# Patient Record
Sex: Male | Born: 1970
Health system: Southern US, Community
[De-identification: ages and names within clinical notes are randomized; demographics above are authoritative.]

## PROBLEM LIST (undated history)

## (undated) DIAGNOSIS — I10 Essential (primary) hypertension: Secondary | ICD-10-CM

## (undated) DIAGNOSIS — G4733 Obstructive sleep apnea (adult) (pediatric): Secondary | ICD-10-CM

## (undated) HISTORY — PX: CYSTECTOMY: SUR359

## (undated) HISTORY — PX: HERNIA REPAIR: SHX51

## (undated) HISTORY — DX: Essential (primary) hypertension: I10

## (undated) HISTORY — PX: APPENDECTOMY: SHX54

## (undated) HISTORY — DX: Obstructive sleep apnea (adult) (pediatric): G47.33

---

## 2003-06-18 ENCOUNTER — Encounter: Admission: RE | Admit: 2003-06-18 | Discharge: 2003-06-18 | Payer: Self-pay | Admitting: *Deleted

## 2003-07-07 ENCOUNTER — Ambulatory Visit (HOSPITAL_BASED_OUTPATIENT_CLINIC_OR_DEPARTMENT_OTHER): Admission: RE | Admit: 2003-07-07 | Discharge: 2003-07-07 | Payer: Self-pay | Admitting: *Deleted

## 2003-07-07 ENCOUNTER — Ambulatory Visit (HOSPITAL_COMMUNITY): Admission: RE | Admit: 2003-07-07 | Discharge: 2003-07-07 | Payer: Self-pay | Admitting: *Deleted

## 2010-10-23 ENCOUNTER — Emergency Department (HOSPITAL_BASED_OUTPATIENT_CLINIC_OR_DEPARTMENT_OTHER)
Admission: EM | Admit: 2010-10-23 | Discharge: 2010-10-24 | Disposition: A | Discharge status: Other Acute Inpatient Hospital | Payer: BC Managed Care – PPO | Source: Home / Self Care | Attending: Emergency Medicine | Admitting: Emergency Medicine

## 2010-10-23 ENCOUNTER — Emergency Department (INDEPENDENT_AMBULATORY_CARE_PROVIDER_SITE_OTHER): Payer: BC Managed Care – PPO

## 2010-10-23 DIAGNOSIS — R141 Gas pain: Secondary | ICD-10-CM

## 2010-10-23 DIAGNOSIS — K37 Unspecified appendicitis: Secondary | ICD-10-CM | POA: Insufficient documentation

## 2010-10-23 DIAGNOSIS — R1031 Right lower quadrant pain: Secondary | ICD-10-CM | POA: Insufficient documentation

## 2010-10-23 LAB — CBC
MCH: 29.5 pg (ref 26.0–34.0)
Platelets: 177 10*3/uL (ref 150–400)
RBC: 5.01 MIL/uL (ref 4.22–5.81)
RDW: 12.5 % (ref 11.5–15.5)

## 2010-10-23 LAB — COMPREHENSIVE METABOLIC PANEL
ALT: 15 U/L (ref 0–53)
AST: 16 U/L (ref 0–37)
Albumin: 4.3 g/dL (ref 3.5–5.2)
CO2: 29 mEq/L (ref 19–32)
Calcium: 9.9 mg/dL (ref 8.4–10.5)
GFR calc non Af Amer: >60 mL/min (ref >60)
Sodium: 137 mEq/L (ref 135–145)
Total Protein: 6.9 g/dL (ref 6.0–8.3)

## 2010-10-23 LAB — DIFFERENTIAL
Basophils Relative: 0 % (ref 0–1)
Eosinophils Absolute: 0.2 10*3/uL (ref 0.0–0.7)
Eosinophils Relative: 1 % (ref 0–5)
Monocytes Relative: 9 % (ref 3–12)
Neutrophils Relative %: 77 % (ref 43–77)

## 2010-10-23 LAB — URINALYSIS, ROUTINE W REFLEX MICROSCOPIC
Hgb urine dipstick: NEGATIVE
Leukocytes, UA: NEGATIVE
Protein, ur: NEGATIVE mg/dL
Urobilinogen, UA: 1 mg/dL (ref 0.0–1.0)

## 2010-10-23 MED ORDER — IOHEXOL 300 MG/ML  SOLN
100.0000 mL | Freq: Once | INTRAMUSCULAR | Status: AC | PRN
  Administered 2010-10-23: 100 mL via INTRAVENOUS

## 2010-10-24 ENCOUNTER — Observation Stay (HOSPITAL_COMMUNITY)
Admit: 2010-10-24 | Discharge: 2010-10-24 | Disposition: A | Discharge status: Home / Self Care | Payer: BC Managed Care – PPO | Attending: General Surgery | Admitting: General Surgery

## 2010-10-24 ENCOUNTER — Other Ambulatory Visit (INDEPENDENT_AMBULATORY_CARE_PROVIDER_SITE_OTHER): Payer: Self-pay | Admitting: General Surgery

## 2010-10-24 DIAGNOSIS — K3580 Unspecified acute appendicitis: Principal | ICD-10-CM | POA: Insufficient documentation

## 2010-10-24 DIAGNOSIS — Z79899 Other long term (current) drug therapy: Secondary | ICD-10-CM | POA: Insufficient documentation

## 2010-10-24 DIAGNOSIS — R1031 Right lower quadrant pain: Secondary | ICD-10-CM

## 2010-10-24 DIAGNOSIS — K352 Acute appendicitis with generalized peritonitis, without abscess: Secondary | ICD-10-CM

## 2010-10-24 DIAGNOSIS — I10 Essential (primary) hypertension: Secondary | ICD-10-CM | POA: Insufficient documentation

## 2010-10-24 DIAGNOSIS — R110 Nausea: Secondary | ICD-10-CM

## 2010-10-30 ENCOUNTER — Telehealth (INDEPENDENT_AMBULATORY_CARE_PROVIDER_SITE_OTHER): Payer: Self-pay

## 2010-10-31 NOTE — H&P (Signed)
  NAMEHADLEY, DETLOFF             ACCOUNT NO.:  [id number]  MEDICAL RECORD NO.:  [id number]  LOCATION:  [facility]                         FACILITY[facility]  [facility]  PHYSICIAN:  Cherylynn Ridges, M.D.    DATE OF BIRTH:  Jul 06, 1970  DATE OF ADMISSION:  10/23/2010 DATE OF DISCHARGE:  10/24/2010                             HISTORY & PHYSICAL   IDENTIFICATION/CHIEF COMPLAINT:  The patient is a 40 year old gentleman with abdominal pain in the right lower quadrant and CT scan demonstrating likely acute appendicitis.  HISTORY OF PRESENT ILLNESS:  The patient's pain began abruptly in the upper abdomen with increased bloating and some discomfort about 6 o'clock yesterday evening associated with some nausea.  No vomiting, no fevers, no chills.  It worsened and then migrated to right lower quadrant.  It got to the point where he had to leave his son in cross practice to go to [facility] where he was ultimately diagnosed with acute appendicitis by CT scan in the abdomen and pelvis. He was subsequently transferred over to our hospital by [facility] because [facility] was not available at this time.  PAST MEDICAL HISTORY:  Significant for hypertension for which he takes lisinopril.  PAST SURGICAL HISTORY:  He has had bilateral inguinal hernia repair as a child and right wrist surgery several years ago at [facility].  He had no problems with anesthesia.  He has no known drug allergies.  Only medication is lisinopril, the dose of which is unknown.  REVIEW OF SYSTEMS:  He had no diarrhea or constipation.  No blood in stools.  No dysuria.  PHYSICAL EXAMINATION:  VITAL SIGNS:  Last vital signs done in [facility]; he is afebrile, pulse was 65, blood pressure 153/84. HEENT:  He is normocephalic and atraumatic and anicteric. NECK:  Supple.  No palpable masses.  No bruits. LUNGS:  Clear to auscultation bilaterally. CARDIAC:  Regular rhythm and rate with no  murmurs. ABDOMEN:  Soft but he is tender in the right lower quadrant with some guarding in that area.  No positive Rovsing sign. RECTAL:  Not performed. NEUROLOGIC:  Cranial nerves II through XII grossly intact.  Mental status exam was normal.  LABORATORY STUDIES:  White count of 15,400, hemoglobin of 14.8.  His electrolytes were all within normal limits.  Glucose is 107.  IMPRESSION:  Acute appendicitis by CT scan.  Clinical examination, the history and physical are very consistent with acute appendicitis.  PLAN:  Laparoscopic, possible open appendectomy.  Based on the CT scan, the patient's appendix appears to be retrocecal in which case, more dissection may be necessary in order to get it out.  However, I do plan on doing it laparoscopically.  Risks and benefits have been explained to the patient, he wished to proceed.     Cherylynn Ridges, M.D.     JOW/MEDQ  D:  10/24/2010  T:  10/24/2010  Job:  [id number]  Electronically Signed by Jimmye Norman M.D. on 10/31/2010 04:54:09 AM

## 2010-10-31 NOTE — Op Note (Signed)
NAMECOLETON, WOON             ACCOUNT NO.:  [id number]  MEDICAL RECORD NO.:  [id number]  LOCATION:  5120                         FACILITY:  [facility]  PHYSICIAN:  Cherylynn Ridges, M.D.    DATE OF BIRTH:  04-27-1971  DATE OF PROCEDURE:  10/24/2010 DATE OF DISCHARGE:                              OPERATIVE REPORT   PREOPERATIVE DIAGNOSIS:  Acute appendicitis.  POSTOPERATIVE DIAGNOSIS:  Early acute appendicitis.  PROCEDURE:  Laparoscopic appendectomy.  SURGEON:  Cherylynn Ridges, MD  ANESTHESIA:  Under general endotracheal.  ESTIMATED BLOOD LOSS:  Less than 20 mL.  COMPLICATIONS:  None.  CONDITION:  Stable.  FINDINGS:  Early acute appendicitis without perforation.  INDICATIONS FOR OPERATION:  The patient is a 40 year old gentleman who was transferred in for [facility] with CT findings of acute appendicitis and a history and physical consistent with that also, who now comes in for a lap appy.  OPERATION:  The patient was taken to the operating room, placed on the table in supine position.  After an adequate general endotracheal anesthetic was administered, he was prepped and draped in usual sterile manner, exposing his entire abdomen.  After a proper time-out was performed identifying the patient and the procedure to be performed, a supraumbilical midline incision was made using a #15 blade and taken down to the midline fascia.  We grabbed the fascia with 2 Kocher clamps and then incised between those clamps into the fascia using a 15 blade.  We went to the preperitoneal space, grabbed the edges of the fascia with Kocher clamps, and tented up on it as we bluntly dissected down into the peritoneal cavity.  Once we were in the peritoneal cavity, a pursestring suture of 0 Vicryl was passed around the fascial opening, we secured in a Hasson cannula which was subsequently passed.  We insufflated carbon dioxide gas up to a maximal intra-abdominal pressure of 50 mmHg  through the Hasson cannula.  Once this was done, the patient was placed in Trendelenburg, left side was tilted down.  Under direct vision, a right upper quadrant 5-mm cannula and a left lower quadrant 11-12-mm cannula were passed under direct vision.  We then used graspers and dissectors in order to identify and mobilize the appendix.  Although it appeared to be in a retrocecal position on CT scan, it was not retroperitonealized at the time of surgery.  We were able to mobilize it easily, isolate the base of the appendix and the mesoappendix from the base of the cecum.  We came across the base of the cecum using a 3.5-mm blue cartridge Endo-GIA and across the mesoappendix using a 2.5-mm white cartridge Endo-GIA.  This completely freed up the appendix which we retrieved from the left lower quadrant site using an EndoCatch bag.  There was no spillage.  We observed and visualized the right lower quadrant area where the appendix was removed.  There was no active bleeding.  We irrigated with about 300-400 mL of saline solution with no bleeding being sought.  We went ahead and removed the supraumbilical cannula and tied off the fascia using a pursestring suture in place.  This was done under the vision of  scope taken through the left lower quadrant cannula site.  We irrigated and aspirated all fluid from above the liver, removed all cannulas and then closed.  Marcaine 0.25% with epi was injected at all sites.  The supraumbilical and left lower quadrant skin sites were closed using a running subcuticular stitch of 4-0 Monocryl.  Dermabond, Steri-Strips, and Tegaderm were used to complete the dressing.  All needle counts, sponge counts, and instrument counts were correct.     Cherylynn Ridges, M.D.     JOW/MEDQ  D:  10/24/2010  T:  10/24/2010  Job:  [id number]  Electronically Signed by Jimmye Norman M.D. on 10/31/2010 08:19:35 AM

## 2010-11-09 ENCOUNTER — Ambulatory Visit (INDEPENDENT_AMBULATORY_CARE_PROVIDER_SITE_OTHER): Payer: BC Managed Care – PPO | Admitting: General Surgery

## 2010-11-09 ENCOUNTER — Encounter (INDEPENDENT_AMBULATORY_CARE_PROVIDER_SITE_OTHER): Payer: Self-pay | Admitting: General Surgery

## 2010-11-09 DIAGNOSIS — Z09 Encounter for follow-up examination after completed treatment for conditions other than malignant neoplasm: Secondary | ICD-10-CM

## 2010-11-09 NOTE — Progress Notes (Signed)
HPI The patient is status post laparoscopic appendectomy on October 25, 2010. His only postoperative problem with constipation leading to some right upper quadrant discomfort that has subsequently resolved. He is currently doing well with no fevers or chills.  PE On examination he is healing well. There is no evidence of infection with any of his wound his abdomen is soft and nontender. The dressings were removed from all incisions.  Studiy review Fungal studies to review currently  Assessment Doing well status post laparoscopic appendectomy  Plan The patient will be held off work until the first week of August the medicine consultation is unable to go back to any type of light duty. We will give him a letter to affect.

## 2010-12-09 NOTE — Discharge Summary (Signed)
  NAMEMAYAN, KLOEPFER             ACCOUNT NO.:  [id number]  MEDICAL RECORD NO.:  [id number]  LOCATION:  5120                         FACILITY:  [facility]  PHYSICIAN:  Mary Sella. Andrey Campanile, MD     DATE OF BIRTH:  [birth year]  DATE OF ADMISSION:  10/24/2010 DATE OF DISCHARGE:  10/24/2010                              DISCHARGE SUMMARY   HISTORY OF PRESENT ILLNESS:  Mr. Cheramie is a 40 year old gentleman who was transferred from [facility] with findings of acute appendicitis.  He was seen by Dr. Lindie Spruce and decision was made to proceed with surgical resection.  SUMMARY OF HOSPITAL COURSE:  The patient was admitted on October 24, 2010. The patient underwent laparoscopic appendectomy without complication. He was admitted to the floor in a stable condition.  Postoperatively, no significant issues were encountered.  The patient began tolerating regular diet and was determined to be stable for discharge same day.  DISCHARGE DIAGNOSIS:  Appendicitis status post laparoscopic appendectomy.  DISCHARGE MEDICATIONS:  The patient was given prescription for Vicodin 1- 2 tablets q.4-6 h p.r.n. pain.  He was given preprinted discharge instructions to follow and report to clinic in approximately 2-3 weeks for followup care.     Brayton El, PA-C   ______________________________ Mary Sella. Andrey Campanile, MD    KB/MEDQ  D:  12/04/2010  T:  12/05/2010  Job:  [id number]  Electronically Signed by Brayton El  on 12/08/2010 09:56:19 AM Electronically Signed by Gaynelle Adu M.D. on 12/09/2010 12:00:56 PM

## 2011-04-16 ENCOUNTER — Other Ambulatory Visit: Payer: Self-pay | Admitting: Dermatology

## 2015-09-30 DIAGNOSIS — R358 Other polyuria: Secondary | ICD-10-CM | POA: Diagnosis not present

## 2015-09-30 DIAGNOSIS — I10 Essential (primary) hypertension: Secondary | ICD-10-CM | POA: Diagnosis not present

## 2015-09-30 DIAGNOSIS — Z1389 Encounter for screening for other disorder: Secondary | ICD-10-CM | POA: Diagnosis not present

## 2015-09-30 DIAGNOSIS — R42 Dizziness and giddiness: Secondary | ICD-10-CM | POA: Diagnosis not present

## 2015-10-21 DIAGNOSIS — Z0000 Encounter for general adult medical examination without abnormal findings: Secondary | ICD-10-CM | POA: Diagnosis not present

## 2015-10-21 DIAGNOSIS — Z125 Encounter for screening for malignant neoplasm of prostate: Secondary | ICD-10-CM | POA: Diagnosis not present

## 2015-10-21 DIAGNOSIS — I10 Essential (primary) hypertension: Secondary | ICD-10-CM | POA: Diagnosis not present

## 2015-11-04 DIAGNOSIS — Z1389 Encounter for screening for other disorder: Secondary | ICD-10-CM | POA: Diagnosis not present

## 2015-11-04 DIAGNOSIS — Z0000 Encounter for general adult medical examination without abnormal findings: Secondary | ICD-10-CM | POA: Diagnosis not present

## 2015-11-04 DIAGNOSIS — I10 Essential (primary) hypertension: Secondary | ICD-10-CM | POA: Diagnosis not present

## 2015-11-04 DIAGNOSIS — G44209 Tension-type headache, unspecified, not intractable: Secondary | ICD-10-CM | POA: Diagnosis not present

## 2015-11-04 DIAGNOSIS — F330 Major depressive disorder, recurrent, mild: Secondary | ICD-10-CM | POA: Diagnosis not present

## 2015-11-04 DIAGNOSIS — R079 Chest pain, unspecified: Secondary | ICD-10-CM | POA: Diagnosis not present

## 2016-11-02 DIAGNOSIS — E039 Hypothyroidism, unspecified: Secondary | ICD-10-CM | POA: Diagnosis not present

## 2016-11-02 DIAGNOSIS — Z125 Encounter for screening for malignant neoplasm of prostate: Secondary | ICD-10-CM | POA: Diagnosis not present

## 2016-11-02 DIAGNOSIS — Z0000 Encounter for general adult medical examination without abnormal findings: Secondary | ICD-10-CM | POA: Diagnosis not present

## 2016-11-02 DIAGNOSIS — I10 Essential (primary) hypertension: Secondary | ICD-10-CM | POA: Diagnosis not present

## 2016-11-09 DIAGNOSIS — G4709 Other insomnia: Secondary | ICD-10-CM | POA: Diagnosis not present

## 2016-11-09 DIAGNOSIS — Z1389 Encounter for screening for other disorder: Secondary | ICD-10-CM | POA: Diagnosis not present

## 2016-11-09 DIAGNOSIS — I10 Essential (primary) hypertension: Secondary | ICD-10-CM | POA: Diagnosis not present

## 2016-11-09 DIAGNOSIS — F330 Major depressive disorder, recurrent, mild: Secondary | ICD-10-CM | POA: Diagnosis not present

## 2016-11-09 DIAGNOSIS — Z0000 Encounter for general adult medical examination without abnormal findings: Secondary | ICD-10-CM | POA: Diagnosis not present

## 2016-11-22 DIAGNOSIS — M62830 Muscle spasm of back: Secondary | ICD-10-CM | POA: Diagnosis not present

## 2016-11-22 DIAGNOSIS — S39012A Strain of muscle, fascia and tendon of lower back, initial encounter: Secondary | ICD-10-CM | POA: Diagnosis not present

## 2017-01-10 DIAGNOSIS — L309 Dermatitis, unspecified: Secondary | ICD-10-CM | POA: Diagnosis not present

## 2017-01-10 DIAGNOSIS — B078 Other viral warts: Secondary | ICD-10-CM | POA: Diagnosis not present

## 2017-01-10 DIAGNOSIS — L821 Other seborrheic keratosis: Secondary | ICD-10-CM | POA: Diagnosis not present

## 2017-11-06 DIAGNOSIS — Z125 Encounter for screening for malignant neoplasm of prostate: Secondary | ICD-10-CM | POA: Diagnosis not present

## 2017-11-06 DIAGNOSIS — Z0000 Encounter for general adult medical examination without abnormal findings: Secondary | ICD-10-CM | POA: Diagnosis not present

## 2017-11-06 DIAGNOSIS — R82998 Other abnormal findings in urine: Secondary | ICD-10-CM | POA: Diagnosis not present

## 2017-11-06 DIAGNOSIS — I10 Essential (primary) hypertension: Secondary | ICD-10-CM | POA: Diagnosis not present

## 2017-11-12 DIAGNOSIS — F330 Major depressive disorder, recurrent, mild: Secondary | ICD-10-CM | POA: Diagnosis not present

## 2017-11-12 DIAGNOSIS — Z1389 Encounter for screening for other disorder: Secondary | ICD-10-CM | POA: Diagnosis not present

## 2017-11-12 DIAGNOSIS — Z0000 Encounter for general adult medical examination without abnormal findings: Secondary | ICD-10-CM | POA: Diagnosis not present

## 2017-11-12 DIAGNOSIS — G4709 Other insomnia: Secondary | ICD-10-CM | POA: Diagnosis not present

## 2017-11-12 DIAGNOSIS — I10 Essential (primary) hypertension: Secondary | ICD-10-CM | POA: Diagnosis not present

## 2017-11-15 DIAGNOSIS — Z1212 Encounter for screening for malignant neoplasm of rectum: Secondary | ICD-10-CM | POA: Diagnosis not present

## 2018-03-05 ENCOUNTER — Other Ambulatory Visit: Payer: Self-pay

## 2018-03-05 ENCOUNTER — Encounter (HOSPITAL_COMMUNITY): Payer: Self-pay | Admitting: Emergency Medicine

## 2018-03-05 ENCOUNTER — Emergency Department (HOSPITAL_COMMUNITY): Payer: BLUE CROSS/BLUE SHIELD

## 2018-03-05 ENCOUNTER — Emergency Department (HOSPITAL_COMMUNITY)
Admission: EM | Admit: 2018-03-05 | Discharge: 2018-03-05 | Disposition: A | Discharge status: Home / Self Care | Payer: BLUE CROSS/BLUE SHIELD | Attending: Emergency Medicine | Admitting: Emergency Medicine

## 2018-03-05 DIAGNOSIS — I10 Essential (primary) hypertension: Secondary | ICD-10-CM | POA: Diagnosis not present

## 2018-03-05 DIAGNOSIS — M79602 Pain in left arm: Secondary | ICD-10-CM | POA: Diagnosis not present

## 2018-03-05 DIAGNOSIS — R001 Bradycardia, unspecified: Secondary | ICD-10-CM | POA: Diagnosis not present

## 2018-03-05 DIAGNOSIS — Z79899 Other long term (current) drug therapy: Secondary | ICD-10-CM | POA: Diagnosis not present

## 2018-03-05 DIAGNOSIS — M79622 Pain in left upper arm: Secondary | ICD-10-CM | POA: Diagnosis not present

## 2018-03-05 LAB — CBC WITH DIFFERENTIAL/PLATELET
Abs Immature Granulocytes: 0.03 10*3/uL (ref 0.00–0.07)
Basophils Absolute: 0 10*3/uL (ref 0.0–0.1)
Basophils Relative: 1 %
Eosinophils Absolute: 0.3 10*3/uL (ref 0.0–0.5)
Eosinophils Relative: 4 %
HCT: 45.8 % (ref 39.0–52.0)
Hemoglobin: 15.7 g/dL (ref 13.0–17.0)
Immature Granulocytes: 0 %
Lymphocytes Relative: 29 %
Lymphs Abs: 2.2 10*3/uL (ref 0.7–4.0)
MCH: 29.6 pg (ref 26.0–34.0)
MCHC: 34.3 g/dL (ref 30.0–36.0)
MCV: 86.3 fL (ref 80.0–100.0)
Monocytes Absolute: 0.7 10*3/uL (ref 0.1–1.0)
Monocytes Relative: 9 %
Neutro Abs: 4.2 10*3/uL (ref 1.7–7.7)
Neutrophils Relative %: 57 %
Platelets: 187 10*3/uL (ref 150–400)
RBC: 5.31 MIL/uL (ref 4.22–5.81)
RDW: 11.7 % (ref 11.5–15.5)
WBC: 7.5 10*3/uL (ref 4.0–10.5)
nRBC: 0 % (ref 0.0–0.2)

## 2018-03-05 LAB — BASIC METABOLIC PANEL
ANION GAP: 9 (ref 5–15)
BUN: 14 mg/dL (ref 6–20)
CHLORIDE: 103 mmol/L (ref 98–111)
CO2: 27 mmol/L (ref 22–32)
Calcium: 9 mg/dL (ref 8.9–10.3)
Creatinine, Ser: 0.89 mg/dL (ref 0.61–1.24)
GFR calc Af Amer: >60 mL/min (ref >60)
GFR calc non Af Amer: >60 mL/min (ref >60)
GLUCOSE: 96 mg/dL (ref 70–99)
POTASSIUM: 3.9 mmol/L (ref 3.5–5.1)
Sodium: 139 mmol/L (ref 135–145)

## 2018-03-05 LAB — I-STAT TROPONIN, ED: Troponin i, poc: 0.01 ng/mL (ref 0.00–0.08)

## 2018-03-05 NOTE — ED Triage Notes (Signed)
PT reports left upper arm discomfort that started at 8am. PT has his BP checked at work and is hypertensive. PT reports he has felt clammy as well. Pt denies chest pain, SOB, neck pain, headaches, and dizziness.

## 2018-03-05 NOTE — ED Notes (Signed)
Lab called - BMET needs to be recollected

## 2018-03-05 NOTE — ED Notes (Signed)
Patient verbalizes understanding of discharge instructions. Opportunity for questioning and answers were provided. Armband removed by staff, pt discharged from ED ambulatory to lobby with wife.

## 2018-03-05 NOTE — Discharge Instructions (Addendum)
Please read attached information. If you experience any new or worsening signs or symptoms please return to the emergency room for evaluation. Please follow-up with your primary care provider or specialist as discussed.  °

## 2018-03-05 NOTE — ED Provider Notes (Signed)
[facility] EMERGENCY DEPARTMENT Provider Note   CSN: [id number] Arrival date & time: 03/05/18  1122   History   Chief Complaint Chief Complaint  Patient presents with  . Hypertension  . Arm Pain    HPI Ryan Skinner is a 47 y.o. male.  HPI   47 year old male presents today with complaints of hypertension.  She notes that this morning around 8:30 AM he developed pain in his left arm along the biceps.  Patient notes this was worse with working.  He notes that he had his blood pressure taken and was noted to be in the 488 systolic range.   Patient denies any associated chest pain or shortness of breath.  Ports that after sitting down and resting symptoms improved.  Patient notes since then symptoms have been coming and going.  Patient denies any personal cardiac history, notes a history of hypertension, denies any history of hyperlipidemia, diabetes, or smoking.  Patient notes his mother did have " minor heart attacks" in her late 48s.  Patient notes he takes losartan for high blood pressure and reports he took it this morning.   Past Medical History:  Diagnosis Date  . Hypertension     Patient Active Problem List   Diagnosis Date Noted  . Postop check 11/09/2010    Past Surgical History:  Procedure Laterality Date  . APPENDECTOMY    . CYSTECTOMY     right wrist      Home Medications    Prior to Admission medications   Medication Sig Start Date End Date Taking? Authorizing Provider  lisinopril (PRINIVIL,ZESTRIL) 10 MG tablet Take 10 mg by mouth daily.      [provider]  nystatin-triamcinolone Lilyan Gilford II) cream  08/09/10   [provider]    Family History Family History  Problem Relation Age of Onset  . Heart disease Mother   . Hypertension Father     Social History Social History   Tobacco Use  . Smoking status: Never Smoker  Substance Use Topics  . Alcohol use: Yes    Comment: occasional  . Drug use: No      Allergies   Patient has no known allergies.  Review of Systems Review of Systems  All other systems reviewed and are negative.  Physical Exam Updated Vital Signs BP (!) 132/92 (BP Location: Right Arm)   Pulse (!) 55   Resp 16   Wt 111.1 kg   SpO2 96%   Physical Exam  Constitutional: He is oriented to person, place, and time. He appears well-developed and well-nourished.  HENT:  Head: Normocephalic and atraumatic.  Eyes: Pupils are equal, round, and reactive to light. Conjunctivae are normal. Right eye exhibits no discharge. Left eye exhibits no discharge. No scleral icterus.  Neck: Normal range of motion. No JVD present. No tracheal deviation present.  Cardiovascular: Normal rate, regular rhythm, normal heart sounds and intact distal pulses. Exam reveals no gallop and no friction rub.  No murmur heard. Pulmonary/Chest: Effort normal and breath sounds normal. No stridor. No respiratory distress. He has no wheezes. He has no rales. He exhibits no tenderness.  Musculoskeletal:  Left arm atraumatic no swelling or edema nontender to palpation radial pulse 2+-no cervical spinal or upper thoracic spinal tenderness to palpation  Neurological: He is alert and oriented to person, place, and time. Coordination normal.  Psychiatric: He has a normal mood and affect. His behavior is normal. Judgment and thought content normal.  Nursing note and vitals  reviewed.   ED Treatments / Results  Labs (all labs ordered are listed, but only abnormal results are displayed) Labs Reviewed  CBC WITH DIFFERENTIAL/PLATELET  BASIC METABOLIC PANEL  I-STAT TROPONIN, ED    EKG None   ED EKG  Radiology Dg Chest 2 View  Result Date: 03/05/2018 CLINICAL DATA:  Left upper arm pain started at 8 a.m. EXAM: CHEST - 2 VIEW COMPARISON:  None. FINDINGS: The heart size and mediastinal contours are within normal limits. Both lungs are clear. The visualized skeletal structures are unremarkable. IMPRESSION:  No active cardiopulmonary disease. Electronically Signed   By: Kathreen Devoid   On: 03/05/2018 13:10    Procedures Procedures (including critical care time)  Medications Ordered in ED Medications - No data to display   Initial Impression / Assessment and Plan / ED Course  I have reviewed the triage vital signs and the nursing notes.  Pertinent labs & imaging results that were available during my care of the patient were reviewed by me and considered in my medical decision making (see chart for details).     Labs: I stat trop, CBC, BMP  Imaging: DG Chest 2 view   Consults:  Therapeutics:  Discharge Meds:   Assessment/Plan: 47 year old male presents today with complaints of left arm pain.  This is nonspecific nonreproducible.  He has no associated chest pain or shortness of breath.  He has a very low heart score of 2 with no signs of ACS, reassuring EKG and troponin.  Symptoms started at 8:30 AM-approximately 3 hours prior to arrival.  Patient will follow-up as an outpatient with his primary care, is given strict return precautions, he verbalized understanding and agreement to today's plan had no further questions or concerns at time discharge.   Final Clinical Impressions(s) / ED Diagnoses   Final diagnoses:  Hypertension, unspecified type  Pain of left upper extremity    ED Discharge Orders    None       Francee Gentile 03/05/18 2232    Gareth Morgan, MD 03/06/18 0008

## 2018-03-12 DIAGNOSIS — I10 Essential (primary) hypertension: Secondary | ICD-10-CM | POA: Diagnosis not present

## 2018-03-12 DIAGNOSIS — Z6831 Body mass index (BMI) 31.0-31.9, adult: Secondary | ICD-10-CM | POA: Diagnosis not present

## 2018-04-01 DIAGNOSIS — Z6831 Body mass index (BMI) 31.0-31.9, adult: Secondary | ICD-10-CM | POA: Diagnosis not present

## 2018-04-01 DIAGNOSIS — I10 Essential (primary) hypertension: Secondary | ICD-10-CM | POA: Diagnosis not present

## 2018-04-01 DIAGNOSIS — L258 Unspecified contact dermatitis due to other agents: Secondary | ICD-10-CM | POA: Diagnosis not present

## 2018-06-18 DIAGNOSIS — B358 Other dermatophytoses: Secondary | ICD-10-CM | POA: Diagnosis not present

## 2018-06-18 DIAGNOSIS — L309 Dermatitis, unspecified: Secondary | ICD-10-CM | POA: Diagnosis not present

## 2018-07-02 DIAGNOSIS — I10 Essential (primary) hypertension: Secondary | ICD-10-CM | POA: Diagnosis not present

## 2018-07-02 DIAGNOSIS — Z6830 Body mass index (BMI) 30.0-30.9, adult: Secondary | ICD-10-CM | POA: Diagnosis not present

## 2018-07-16 DIAGNOSIS — L281 Prurigo nodularis: Secondary | ICD-10-CM | POA: Diagnosis not present

## 2018-07-16 DIAGNOSIS — L309 Dermatitis, unspecified: Secondary | ICD-10-CM | POA: Diagnosis not present

## 2018-07-16 DIAGNOSIS — B359 Dermatophytosis, unspecified: Secondary | ICD-10-CM | POA: Diagnosis not present

## 2019-03-09 DIAGNOSIS — Z125 Encounter for screening for malignant neoplasm of prostate: Secondary | ICD-10-CM | POA: Diagnosis not present

## 2019-03-09 DIAGNOSIS — I10 Essential (primary) hypertension: Secondary | ICD-10-CM | POA: Diagnosis not present

## 2019-03-09 DIAGNOSIS — R7989 Other specified abnormal findings of blood chemistry: Secondary | ICD-10-CM | POA: Diagnosis not present

## 2019-03-09 DIAGNOSIS — Z0000 Encounter for general adult medical examination without abnormal findings: Secondary | ICD-10-CM | POA: Diagnosis not present

## 2019-03-16 DIAGNOSIS — F330 Major depressive disorder, recurrent, mild: Secondary | ICD-10-CM | POA: Diagnosis not present

## 2019-03-16 DIAGNOSIS — M79602 Pain in left arm: Secondary | ICD-10-CM | POA: Diagnosis not present

## 2019-03-16 DIAGNOSIS — I10 Essential (primary) hypertension: Secondary | ICD-10-CM | POA: Diagnosis not present

## 2019-03-16 DIAGNOSIS — Z1331 Encounter for screening for depression: Secondary | ICD-10-CM | POA: Diagnosis not present

## 2019-03-16 DIAGNOSIS — Z0000 Encounter for general adult medical examination without abnormal findings: Secondary | ICD-10-CM | POA: Diagnosis not present

## 2019-03-16 DIAGNOSIS — Z23 Encounter for immunization: Secondary | ICD-10-CM | POA: Diagnosis not present

## 2019-03-16 DIAGNOSIS — G4700 Insomnia, unspecified: Secondary | ICD-10-CM | POA: Diagnosis not present

## 2019-04-29 DIAGNOSIS — M67912 Unspecified disorder of synovium and tendon, left shoulder: Secondary | ICD-10-CM | POA: Diagnosis not present

## 2019-05-26 DIAGNOSIS — Z20828 Contact with and (suspected) exposure to other viral communicable diseases: Secondary | ICD-10-CM | POA: Diagnosis not present

## 2019-06-12 ENCOUNTER — Ambulatory Visit: Payer: Self-pay

## 2020-01-06 DIAGNOSIS — K921 Melena: Secondary | ICD-10-CM | POA: Diagnosis not present

## 2020-01-07 ENCOUNTER — Encounter: Payer: Self-pay | Admitting: Nurse Practitioner

## 2020-01-14 DIAGNOSIS — R05 Cough: Secondary | ICD-10-CM | POA: Diagnosis not present

## 2020-01-14 DIAGNOSIS — Z1152 Encounter for screening for COVID-19: Secondary | ICD-10-CM | POA: Diagnosis not present

## 2020-01-14 DIAGNOSIS — U071 COVID-19: Secondary | ICD-10-CM | POA: Diagnosis not present

## 2020-01-25 DIAGNOSIS — M67911 Unspecified disorder of synovium and tendon, right shoulder: Secondary | ICD-10-CM | POA: Diagnosis not present

## 2020-02-10 ENCOUNTER — Ambulatory Visit: Payer: Self-pay | Admitting: Nurse Practitioner

## 2020-02-19 ENCOUNTER — Ambulatory Visit: Payer: Self-pay | Admitting: Gastroenterology

## 2020-04-01 ENCOUNTER — Ambulatory Visit (INDEPENDENT_AMBULATORY_CARE_PROVIDER_SITE_OTHER): Payer: BC Managed Care – PPO | Admitting: Gastroenterology

## 2020-04-01 ENCOUNTER — Encounter: Payer: Self-pay | Admitting: Gastroenterology

## 2020-04-01 VITALS — BP 140/76 | HR 57 | Ht 74.5 in | Wt 221.0 lb

## 2020-04-01 DIAGNOSIS — K921 Melena: Secondary | ICD-10-CM | POA: Diagnosis not present

## 2020-04-01 MED ORDER — NA SULFATE-K SULFATE-MG SULF 17.5-3.13-1.6 GM/177ML PO SOLN: 1.0000 | Freq: Once | ORAL | 0 refills | Status: AC

## 2020-04-01 NOTE — Progress Notes (Signed)
History of Present Illness: This is a 49 year old male referred by Ryan Hatchet, MD for the evaluation of a change in bowel habits and hematochezia. He relates change in his diet a few months ago and he notes less frequent stools with occasional straining. In July had an episode of painless hematochezia. He notes mild intermittent LLQ pain. No prior colonoscopy. Denies weight loss, abdominal pain, diarrhea, change in stool caliber, melena, nausea, vomiting, dysphagia, reflux symptoms, chest pain.    No Known Allergies Outpatient Medications Prior to Visit  Medication Sig Dispense Refill  . lisinopril (PRINIVIL,ZESTRIL) 10 MG tablet Take 10 mg by mouth daily.      Marland Kitchen nystatin-triamcinolone (MYCOLOG II) cream      No facility-administered medications prior to visit.   Past Medical History:  Diagnosis Date  . Hypertension    Past Surgical History:  Procedure Laterality Date  . APPENDECTOMY    . CYSTECTOMY     right wrist  . HERNIA REPAIR     Social History   Socioeconomic History  . Marital status: Married    Spouse name: Not on file  . Number of children: Not on file  . Years of education: Not on file  . Highest education level: Not on file  Occupational History  . Not on file  Tobacco Use  . Smoking status: Never Smoker  Substance and Sexual Activity  . Alcohol use: Yes    Comment: occasional  . Drug use: No  . Sexual activity: Not on file  Other Topics Concern  . Not on file  Social History Narrative  . Not on file   Social Determinants of Health   Financial Resource Strain:   . Difficulty of Paying Living Expenses: Not on file  Food Insecurity:   . Worried About Charity fundraiser in the Last Year: Not on file  . Ran Out of Food in the Last Year: Not on file  Transportation Needs:   . Lack of Transportation (Medical): Not on file  . Lack of Transportation (Non-Medical): Not on file  Physical Activity:   . Days of Exercise per Week: Not on file  .  Minutes of Exercise per Session: Not on file  Stress:   . Feeling of Stress : Not on file  Social Connections:   . Frequency of Communication with Friends and Family: Not on file  . Frequency of Social Gatherings with Friends and Family: Not on file  . Attends Religious Services: Not on file  . Active Member of Clubs or Organizations: Not on file  . Attends Archivist Meetings: Not on file  . Marital Status: Not on file   Family History  Problem Relation Age of Onset  . Heart disease Mother   . Hypertension Father   . Diabetes Father   . Prostate cancer Paternal Uncle      Review of Systems: Pertinent positive and negative review of systems were noted in the above HPI section. All other review of systems were otherwise negative.   Physical Exam: General: Well developed, well nourished, no acute distress Head: Normocephalic and atraumatic Eyes:  sclerae anicteric, EOMI Ears: Normal auditory acuity Mouth: Not examined, mask on during Covid-19 pandemic Neck: Supple, no masses or thyromegaly Lungs: Clear throughout to auscultation Heart: Regular rate and rhythm; no murmurs, rubs or bruits Abdomen: Soft, non tender and non distended. No masses, hepatosplenomegaly or hernias noted. Normal Bowel sounds Rectal: Deferred to colonoscopy Musculoskeletal: Symmetrical with no gross  deformities  Skin: No lesions on visible extremities Pulses:  Normal pulses noted Extremities: No clubbing, cyanosis, edema or deformities noted Neurological: Alert oriented x 4, grossly nonfocal Cervical Nodes:  No significant cervical adenopathy Inguinal Nodes: No significant inguinal adenopathy Psychological:  Alert and cooperative. Normal mood and affect   Assessment and Recommendations:  1.  Change in bowel habits, mild constipation, hematochezia.  Evaluate for colorectal neoplasms.  Increase dietary fiber and daily water intake.  Schedule colonoscopy. The risks (including bleeding,  perforation, infection, missed lesions, medication reactions and possible hospitalization or surgery if complications occur), benefits, and alternatives to colonoscopy with possible biopsy and possible polypectomy were discussed with the patient and they consent to proceed.     cc: Ryan Hatchet, MD

## 2020-04-01 NOTE — Patient Instructions (Signed)
You have been scheduled for a colonoscopy. Please follow written instructions given to you at your visit today.  Please pick up your prep supplies at the pharmacy within the next 1-3 days. If you use inhalers (even only as needed), please bring them with you on the day of your procedure.  Thank you for choosing me and [facility].  Malcolm T. Stark, Jr., MD., FACG  

## 2020-05-20 DIAGNOSIS — Z125 Encounter for screening for malignant neoplasm of prostate: Secondary | ICD-10-CM | POA: Diagnosis not present

## 2020-05-20 DIAGNOSIS — Z79899 Other long term (current) drug therapy: Secondary | ICD-10-CM | POA: Diagnosis not present

## 2020-05-20 DIAGNOSIS — I10 Essential (primary) hypertension: Secondary | ICD-10-CM | POA: Diagnosis not present

## 2020-05-27 DIAGNOSIS — Z23 Encounter for immunization: Secondary | ICD-10-CM | POA: Diagnosis not present

## 2020-05-27 DIAGNOSIS — R0789 Other chest pain: Secondary | ICD-10-CM | POA: Diagnosis not present

## 2020-05-27 DIAGNOSIS — Z0000 Encounter for general adult medical examination without abnormal findings: Secondary | ICD-10-CM | POA: Diagnosis not present

## 2020-05-27 DIAGNOSIS — R82998 Other abnormal findings in urine: Secondary | ICD-10-CM | POA: Diagnosis not present

## 2020-05-31 DIAGNOSIS — K921 Melena: Secondary | ICD-10-CM | POA: Diagnosis not present

## 2020-06-03 ENCOUNTER — Telehealth: Payer: Self-pay | Admitting: Gastroenterology

## 2020-06-03 ENCOUNTER — Other Ambulatory Visit: Payer: Self-pay

## 2020-06-03 ENCOUNTER — Encounter: Payer: Self-pay | Admitting: Gastroenterology

## 2020-06-03 ENCOUNTER — Ambulatory Visit (AMBULATORY_SURGERY_CENTER): Payer: BC Managed Care – PPO | Admitting: Gastroenterology

## 2020-06-03 VITALS — BP 112/67 | HR 56 | Temp 97.0°F | Resp 21 | Ht 74.0 in | Wt 221.0 lb

## 2020-06-03 DIAGNOSIS — D120 Benign neoplasm of cecum: Secondary | ICD-10-CM | POA: Diagnosis not present

## 2020-06-03 DIAGNOSIS — K5730 Diverticulosis of large intestine without perforation or abscess without bleeding: Secondary | ICD-10-CM | POA: Diagnosis not present

## 2020-06-03 DIAGNOSIS — K640 First degree hemorrhoids: Secondary | ICD-10-CM | POA: Diagnosis not present

## 2020-06-03 DIAGNOSIS — Z1211 Encounter for screening for malignant neoplasm of colon: Secondary | ICD-10-CM | POA: Diagnosis not present

## 2020-06-03 DIAGNOSIS — D123 Benign neoplasm of transverse colon: Secondary | ICD-10-CM

## 2020-06-03 DIAGNOSIS — R194 Change in bowel habit: Secondary | ICD-10-CM

## 2020-06-03 DIAGNOSIS — D124 Benign neoplasm of descending colon: Secondary | ICD-10-CM

## 2020-06-03 DIAGNOSIS — K921 Melena: Secondary | ICD-10-CM | POA: Diagnosis not present

## 2020-06-03 DIAGNOSIS — D125 Benign neoplasm of sigmoid colon: Secondary | ICD-10-CM | POA: Diagnosis not present

## 2020-06-03 MED ORDER — SODIUM CHLORIDE 0.9 % IV SOLN: 500.0000 mL | INTRAVENOUS | Status: DC

## 2020-06-03 NOTE — Op Note (Signed)
[place] Patient Name: Ryan Skinner Procedure Date: 06/03/2020 8:59 AM MRN: [id number] Endoscopist: Ladene Artist , MD Age: 50 Referring MD:  Date of Birth: Apr 11, 1971 Gender: Male Account #: [id number] Procedure:                Colonoscopy Indications:              Hematochezia, Change in bowel habits Medicines:                Monitored Anesthesia Care Procedure:                Pre-Anesthesia Assessment:                           - Prior to the procedure, a History and Physical                            was performed, and patient medications and                            allergies were reviewed. The patient's tolerance of                            previous anesthesia was also reviewed. The risks                            and benefits of the procedure and the sedation                            options and risks were discussed with the patient.                            All questions were answered, and informed consent                            was obtained. Prior Anticoagulants: The patient has                            taken no previous anticoagulant or antiplatelet                            agents. ASA Grade Assessment: II - A patient with                            mild systemic disease. After reviewing the risks                            and benefits, the patient was deemed in                            satisfactory condition to undergo the procedure.                           After obtaining informed consent, the colonoscope  was passed under direct vision. Throughout the                            procedure, the patient's blood pressure, pulse, and                            oxygen saturations were monitored continuously. The                            Olympus CF-HQ190 [id number]) Colonoscope was                            introduced through the anus and advanced to the the                            cecum, identified by  appendiceal orifice and                            ileocecal valve. The ileocecal valve, appendiceal                            orifice, and rectum were photographed. The quality                            of the bowel preparation was good. The colonoscopy                            was performed without difficulty. The patient                            tolerated the procedure well. Scope In: 9:18:40 AM Scope Out: 9:44:51 AM Scope Withdrawal Time: 0 hours 23 minutes 58 seconds  Total Procedure Duration: 0 hours 26 minutes 11 seconds  Findings:                 The perianal and digital rectal examinations were                            normal.                           A 15 mm polyp was found in the descending colon.                            The polyp was semi-pedunculated. The polyp was                            removed with a hot snare. Resection and retrieval                            were complete.                           Four sessile polyps were found in the sigmoid  colon, transverse colon (2) and cecum. The polyps                            were 5 to 7 mm in size. These polyps were removed                            with a cold snare. Resection and retrieval were                            complete.                           A few small-mouthed diverticula were found in the                            left colon. There was no evidence of diverticular                            bleeding.                           Internal hemorrhoids were found during                            retroflexion. The hemorrhoids were moderate and                            Grade I (internal hemorrhoids that do not prolapse).                           The exam was otherwise without abnormality on                            direct and retroflexion views. Complications:            No immediate complications. Estimated blood loss:                            None. Estimated  Blood Loss:     Estimated blood loss: none. Impression:               - One 15 mm polyp in the descending colon, removed                            with a hot snare. Resected and retrieved.                           - Four 5 to 7 mm polyps in the sigmoid colon, in                            the transverse colon and in the cecum, removed with                            a cold snare. Resected and retrieved.                           -  Mild diverticulosis in the left colon.                           - Internal hemorrhoids.                           - The examination was otherwise normal on direct                            and retroflexion views. Recommendation:           - Repeat colonoscopy date to be determined after                            pending pathology results are reviewed for                            surveillance based on pathology results.                           - Patient has a contact number available for                            emergencies. The signs and symptoms of potential                            delayed complications were discussed with the                            patient. Return to normal activities tomorrow.                            Written discharge instructions were provided to the                            patient.                           - High fiber diet.                           - Continue present medications.                           - Preparation H supp PR qd prn hemorrhoid symptoms.                           - Consider hemorrhoid banding if hemorrhoid                            symptoms persists.                           - Await pathology results.                           - No aspirin, ibuprofen, naproxen, or other  non-steroidal anti-inflammatory drugs for 2 weeks                            after polyp removal. Ladene Artist, MD 06/03/2020 9:51:59 AM This report has been signed electronically.

## 2020-06-03 NOTE — Progress Notes (Signed)
Report given to PACU, vss 

## 2020-06-03 NOTE — Telephone Encounter (Signed)
error 

## 2020-06-03 NOTE — Patient Instructions (Signed)
Handouts given:  High fiber diet Continue current medications Preparation H suppository every day as needed for hemorrhoids Consider hemorrhoidal banding No aspirin, ibuprofen, naproxen or any other NSAIDS for 2 weeks Await pathology results  YOU HAD AN ENDOSCOPIC PROCEDURE TODAY AT [place]:   Refer to the procedure report that was given to you for any specific questions about what was found during the examination.  If the procedure report does not answer your questions, please call your gastroenterologist to clarify.  If you requested that your care partner not be given the details of your procedure findings, then the procedure report has been included in a sealed envelope for you to review at your convenience later.  YOU SHOULD EXPECT: Some feelings of bloating in the abdomen. Passage of more gas than usual.  Walking can help get rid of the air that was put into your GI tract during the procedure and reduce the bloating. If you had a lower endoscopy (such as a colonoscopy or flexible sigmoidoscopy) you may notice spotting of blood in your stool or on the toilet paper. If you underwent a bowel prep for your procedure, you may not have a normal bowel movement for a few days.  Please Note:  You might notice some irritation and congestion in your nose or some drainage.  This is from the oxygen used during your procedure.  There is no need for concern and it should clear up in a day or so.  SYMPTOMS TO REPORT IMMEDIATELY:   Following lower endoscopy (colonoscopy or flexible sigmoidoscopy):  Excessive amounts of blood in the stool  Significant tenderness or worsening of abdominal pains  Swelling of the abdomen that is new, acute  Fever of 100F or higher  For urgent or emergent issues, a gastroenterologist can be reached at any hour by calling [phone]. Do not use MyChart messaging for urgent concerns.   DIET:  We do recommend a small meal at first, but then you may  proceed to your regular diet.  Drink plenty of fluids but you should avoid alcoholic beverages for 24 hours.  ACTIVITY:  You should plan to take it easy for the rest of today and you should NOT DRIVE or use heavy machinery until tomorrow (because of the sedation medicines used during the test).    FOLLOW UP: Our staff will call the number listed on your records 48-72 hours following your procedure to check on you and address any questions or concerns that you may have regarding the information given to you following your procedure. If we do not reach you, we will leave a message.  We will attempt to reach you two times.  During this call, we will ask if you have developed any symptoms of COVID 19. If you develop any symptoms (ie: fever, flu-like symptoms, shortness of breath, cough etc.) before then, please call [phone].  If you test positive for Covid 19 in the 2 weeks post procedure, please call and report this information to [place].    If any biopsies were taken you will be contacted by phone or by letter within the next 1-3 weeks.  Please call us at [phone] if you have not heard about the biopsies in 3 weeks.   SIGNATURES/CONFIDENTIALITY: You and/or your care partner have signed paperwork which will be entered into your electronic medical record.  These signatures attest to the fact that that the information above on your After Visit Summary has been reviewed and is understood.  Full responsibility of the confidentiality of this discharge information lies with you and/or your care-partner.

## 2020-06-03 NOTE — Progress Notes (Signed)
VS taken by C.W. 

## 2020-06-03 NOTE — Progress Notes (Signed)
Called to room to assist during endoscopic procedure.  Patient ID and intended procedure confirmed with present staff. Received instructions for my participation in the procedure from the performing physician.  

## 2020-06-03 NOTE — Progress Notes (Signed)
HR 43 on check-in, recheck is 40, patient asymptomatic, CRNA notified.

## 2020-06-06 DIAGNOSIS — R0789 Other chest pain: Secondary | ICD-10-CM | POA: Diagnosis not present

## 2020-06-06 DIAGNOSIS — I10 Essential (primary) hypertension: Secondary | ICD-10-CM | POA: Diagnosis not present

## 2020-06-07 ENCOUNTER — Telehealth: Payer: Self-pay

## 2020-06-07 ENCOUNTER — Telehealth: Payer: Self-pay | Admitting: *Deleted

## 2020-06-07 NOTE — Telephone Encounter (Signed)
Attempted 2nd f/u phone call. No answer. Left message.  °

## 2020-06-07 NOTE — Telephone Encounter (Signed)
Did not leave message. Permission not given.

## 2020-06-10 DIAGNOSIS — I2510 Atherosclerotic heart disease of native coronary artery without angina pectoris: Secondary | ICD-10-CM | POA: Diagnosis not present

## 2020-06-22 ENCOUNTER — Encounter: Payer: Self-pay | Admitting: Gastroenterology

## 2020-11-11 DIAGNOSIS — M549 Dorsalgia, unspecified: Secondary | ICD-10-CM | POA: Diagnosis not present

## 2020-11-26 DIAGNOSIS — Z20822 Contact with and (suspected) exposure to covid-19: Secondary | ICD-10-CM | POA: Diagnosis not present

## 2020-11-26 DIAGNOSIS — R059 Cough, unspecified: Secondary | ICD-10-CM | POA: Diagnosis not present

## 2021-04-17 ENCOUNTER — Other Ambulatory Visit: Payer: Self-pay

## 2021-04-17 ENCOUNTER — Encounter (INDEPENDENT_AMBULATORY_CARE_PROVIDER_SITE_OTHER): Payer: Self-pay

## 2021-04-17 ENCOUNTER — Ambulatory Visit: Payer: BC Managed Care – PPO | Admitting: Dermatology

## 2021-04-17 DIAGNOSIS — L570 Actinic keratosis: Secondary | ICD-10-CM

## 2021-04-17 DIAGNOSIS — B078 Other viral warts: Secondary | ICD-10-CM

## 2021-04-17 DIAGNOSIS — L304 Erythema intertrigo: Secondary | ICD-10-CM | POA: Diagnosis not present

## 2021-04-17 DIAGNOSIS — D2372 Other benign neoplasm of skin of left lower limb, including hip: Secondary | ICD-10-CM

## 2021-04-17 DIAGNOSIS — D239 Other benign neoplasm of skin, unspecified: Secondary | ICD-10-CM

## 2021-04-17 DIAGNOSIS — D235 Other benign neoplasm of skin of trunk: Secondary | ICD-10-CM

## 2021-04-17 MED ORDER — HYDROCORTISONE 2.5 % EX CREA: TOPICAL_CREAM | Freq: Two times a day (BID) | CUTANEOUS | 5 refills | Status: AC | PRN

## 2021-04-17 NOTE — Patient Instructions (Signed)
Call after the holiday for update on how you are doing.

## 2021-05-10 ENCOUNTER — Encounter: Payer: Self-pay | Admitting: Dermatology

## 2021-05-10 NOTE — Progress Notes (Signed)
° °  Follow-Up Visit   Subjective  Ryan Skinner is a 51 y.o. male who presents for the following: Skin Problem (Patient here today for lesion on his scalp x 6 months no bleeding, was rough now smooth. Patient also has a lesion under right axilla x 1 year no bleeding, red and irritated. Check lesion in groin x 1 year no bleeding, red and irritated per patient he's been using over the counter cortisone cream that seems to help but the irritation returns. Check lesion on left arm x 6 months no bleeding no pain. ).  Several growths she would like checked. Location:  Duration:  Quality:  Associated Signs/Symptoms: Modifying Factors:  Severity:  Timing: Context:   Objective  Well appearing patient in no apparent distress; mood and affect are within normal limits. Scalp Slightly inflamed to millimeter textured pink crust, historically stable, this could represent an irritated seborrheic keratosis or small actinic keratosis.  Left Inguinal Area, Right Axilla Slightly glazed erythema without margination or pustules or satellites or atrophy or blisters.  This best fits an irritant dermatitis.  No sign of tinea on feet.  Left Lower Leg - Anterior, Mid Back Firm pink 5 mm dermal papule with compatible dermoscopy  Left 2nd Finger Tip Lesion is bothersome to patient so she did request therapy.  I explained the absence of a proven antiviral so we will do a liquid nitrogen freeze followed by use of an over-the-counter home freeze for any residual.       A full examination was performed including scalp, head, eyes, ears, nose, lips, neck, chest, axillae, abdomen, back, buttocks, bilateral upper extremities, bilateral lower extremities, hands, feet, fingers, toes, fingernails, and toenails. All findings within normal limits unless otherwise noted below.  Areas beneath undergarments not fully examined.   Assessment & Plan    AK (actinic keratosis) Scalp  Defer freezing or biopsy this  close to the holiday.  Will return if there is growth or bleeding.  Erythema intertrigo Right Axilla; Left Inguinal Area  Gust ways to minimize irritation including use of triple paste after the rashes clear to reduce recurrence.  May use 2 and a half percent hydrocortisone daily after bathing for 2-4 weeks.  Patient will call in 1 month for update.  hydrocortisone 2.5 % cream - Left Inguinal Area, Right Axilla Apply topically 2 (two) times daily as needed (Rash).  Dermatofibroma (2) Left Lower Leg - Anterior; Mid Back  Benign no treatment needed if clinically stable  Common wart Left 2nd Finger Tip  Destruction of lesion - Left 2nd Finger Tip Complexity: simple   Destruction method: cryotherapy   Informed consent: discussed and consent obtained   Timeout:  patient name, date of birth, surgical site, and procedure verified Lesion destroyed using liquid nitrogen: Yes   Cryotherapy cycles:  5 Outcome: patient tolerated procedure well with no complications   Post-procedure details: wound care instructions given        I, Lavonna Monarch, MD, have reviewed all documentation for this visit.  The documentation on 05/10/21 for the exam, diagnosis, procedures, and orders are all accurate and complete.

## 2021-08-21 ENCOUNTER — Telehealth: Payer: Self-pay

## 2021-08-21 NOTE — Telephone Encounter (Signed)
NOTES SCANNED TO REFERRAL 

## 2021-09-01 ENCOUNTER — Ambulatory Visit: Payer: BC Managed Care – PPO | Admitting: Cardiovascular Disease

## 2021-09-01 ENCOUNTER — Encounter: Payer: Self-pay | Admitting: Cardiovascular Disease

## 2021-09-01 DIAGNOSIS — R931 Abnormal findings on diagnostic imaging of heart and coronary circulation: Secondary | ICD-10-CM

## 2021-09-01 DIAGNOSIS — Z8249 Family history of ischemic heart disease and other diseases of the circulatory system: Secondary | ICD-10-CM

## 2021-09-01 DIAGNOSIS — E785 Hyperlipidemia, unspecified: Secondary | ICD-10-CM | POA: Insufficient documentation

## 2021-09-01 DIAGNOSIS — E782 Mixed hyperlipidemia: Secondary | ICD-10-CM

## 2021-09-01 LAB — BASIC METABOLIC PANEL
BUN/Creatinine Ratio: 23 — ABNORMAL HIGH (ref 9–20)
BUN: 19 mg/dL (ref 6–24)
CO2: 28 mmol/L (ref 20–29)
Calcium: 9.7 mg/dL (ref 8.7–10.2)
Chloride: 100 mmol/L (ref 96–106)
Creatinine, Ser: 0.82 mg/dL (ref 0.76–1.27)
Glucose: 120 mg/dL — ABNORMAL HIGH (ref 70–99)
Potassium: 4.1 mmol/L (ref 3.5–5.2)
Sodium: 139 mmol/L (ref 134–144)
eGFR: 106 mL/min/{1.73_m2} (ref >59)

## 2021-09-01 MED ORDER — METOPROLOL TARTRATE 25 MG PO TABS: 25.0000 mg | ORAL_TABLET | Freq: Once | ORAL | 0 refills | Status: DC

## 2021-09-01 NOTE — Assessment & Plan Note (Signed)
Mr. Lohmeyer was referred to me by Dr. Ardeth Perfect because of elevated coronary calcium score that was ordered 06/10/2020.  Total coronary calcium was 122 distributed in all 3 coronary arteries.  He is completely asymptomatic.  As result of this he was placed on low-dose statin therapy which apparently was effective.  He now complains of some atypical symptoms after drinking water in his axilla rating to his back.  While this does not necessarily sound ischemic I am going to get a coronary CTA to further evaluate given his family history. ?

## 2021-09-01 NOTE — Assessment & Plan Note (Signed)
His mother had myocardial infarction in her 31s.  His father had CABG at age 52. ?

## 2021-09-01 NOTE — Patient Instructions (Addendum)
Medication Instructions:  ?Your physician recommends that you continue on your current medications as directed. Please refer to the Current Medication list given to you today. ? ?*If you need a refill on your cardiac medications before your next appointment, please call your pharmacy* ? ? ?Lab Work: ?Your physician recommends that you have labs drawn today: BMET ? ?If you have labs (blood work) drawn today and your tests are completely normal, you will receive your results only by: ?MyChart Message (if you have MyChart) OR ?A paper copy in the mail ?If you have any lab test that is abnormal or we need to change your treatment, we will call you to review the results. ? ? ?Testing/Procedures: ?See below ? ? ?Follow-Up: ?At [facility], you and your health needs are our priority.  As part of our continuing mission to provide you with exceptional heart care, we have created designated Provider Care Teams.  These Care Teams include your primary Cardiologist (physician) and Advanced Practice Providers (APPs -  Physician Assistants and Nurse Practitioners) who all work together to provide you with the care you need, when you need it. ? ?We recommend signing up for the patient portal called "MyChart".  Sign up information is provided on this After Visit Summary.  MyChart is used to connect with patients for Virtual Visits (Telemedicine).  Patients are able to view lab/test results, encounter notes, upcoming appointments, etc.  Non-urgent messages can be sent to your provider as well.   ?To learn more about what you can do with MyChart, go to [email or web address].   ? ?Your next appointment:   ?12 month(s) ? ?The format for your next appointment:   ?In Person ? ?Provider:   ?Quay Burow, MD ? ? ?Other Instructions ? ? ?Your cardiac CT will be scheduled at the below location:  ? ?[facility] ?[address] ?[place], [place] [address] ?[phone] ? ? ?If scheduled at [facility], please arrive  at the [facility]'s Entrance (Entrance C2) of [facility] 30 minutes prior to test start time. ?You can use the FREE valet parking offered at entrance C (encouraged to control the heart rate for the test)  ?Proceed to the [facility] Radiology Department (first floor) to check-in and test prep. ? ?All radiology patients and guests should use entrance C2 at [facility], accessed from [facility], even though the hospital's physical address listed is [address]. ? ? ? ? ?Please follow these instructions carefully (unless otherwise directed): ? ?Hold all erectile dysfunction medications at least 3 days (72 hrs) prior to test. ? ?On the Night Before the Test: ?Be sure to Drink plenty of water. ?Do not consume any caffeinated/decaffeinated beverages or chocolate 12 hours prior to your test. ?Do not take any antihistamines 12 hours prior to your test. ? ?On the Day of the Test: ?Drink plenty of water until 1 hour prior to the test. ?Do not eat any food 4 hours prior to the test. ?You may take your regular medications prior to the test.  ?Take metoprolol (Lopressor)'25mg'$  two hours prior to test. ?HOLD Furosemide/Hydrochlorothiazide morning of the test. ?     ?After the Test: ?Drink plenty of water. ?After receiving IV contrast, you may experience a mild flushed feeling. This is normal. ?On occasion, you may experience a mild rash up to 24 hours after the test. This is not dangerous. If this occurs, you can take Benadryl 25 mg and increase your fluid intake. ?  If you experience trouble breathing, this can be serious. If it is severe call 911 IMMEDIATELY. If it is mild, please call our office. ?If you take any of these medications: Glipizide/Metformin, Avandament, Glucavance, please do not take 48 hours after completing test unless otherwise instructed. ? ?We will call to schedule your test 2-4 weeks out understanding that some insurance companies will need an authorization  prior to the service being performed.  ? ?For non-scheduling related questions, please contact the cardiac imaging nurse navigator should you have any questions/concerns: ?Marchia Bond, Cardiac Imaging Nurse Navigator ?Gordy Clement, Cardiac Imaging Nurse Navigator ?[facility] ?Direct Office Dial: [phone]  ? ?For scheduling needs, including cancellations and rescheduling, please call [place], [phone].  ?

## 2021-09-01 NOTE — Progress Notes (Signed)
? ? ? ?09/01/2021 ?Lujean Rave Goll   ?1971/01/18  ?[id number] ? ?Primary Physician Velna Hatchet, MD ?Primary Cardiologist: Lorretta Harp MD Lupe Carney, [place] ? ?HPI:  Ryan Skinner is a 51 y.o. mildly overweight married Caucasian male father of 2 children who is referred by Dr. Kinnie Scales, his primary care physician, for cardiovascular valuation because of a mildly elevated coronary calcium score.  He works as a Air traffic controller.  His cardiac risk factors profile is notable for treated hypertension and hyperlipidemia.  He does not smoke.  Both his parents had CAD.  He is never had a heart attack or stroke.  He denies chest pain or shortness of breath.  He did have a coronary calcium score performed 06/10/2020 which was 122 distributed throughout his coronary tree.  He does complain of some atypical symptoms after drinking water with discomfort in his axilla bilaterally rating to his back. ? ? ?Current Meds  ?Medication Sig  ? hydrocortisone 2.5 % cream Apply topically 2 (two) times daily as needed (Rash).  ? losartan-hydrochlorothiazide (HYZAAR) 100-25 MG tablet Take 1 tablet by mouth daily.  ? rosuvastatin (CRESTOR) 5 MG tablet Take 5 mg by mouth at bedtime.  ?  ? ?No Known Allergies ? ?Social History  ? ?Socioeconomic History  ? Marital status: Married  ?  Spouse name: Not on file  ? Number of children: Not on file  ? Years of education: Not on file  ? Highest education level: Not on file  ?Occupational History  ? Not on file  ?Tobacco Use  ? Smoking status: Never  ? Smokeless tobacco: Never  ?Substance and Sexual Activity  ? Alcohol use: Yes  ?  Comment: occasional  ? Drug use: No  ? Sexual activity: Not on file  ?Other Topics Concern  ? Not on file  ?Social History Narrative  ? Not on file  ? ?Social Determinants of Health  ? ?Financial Resource Strain: Not on file  ?Food Insecurity: Not on file  ?Transportation Needs: Not on file  ?Physical Activity: Not on file  ?Stress: Not on file   ?Social Connections: Not on file  ?Intimate Partner Violence: Not on file  ?  ? ?Review of Systems: ?General: negative for chills, fever, night sweats or weight changes.  ?Cardiovascular: negative for chest pain, dyspnea on exertion, edema, orthopnea, palpitations, paroxysmal nocturnal dyspnea or shortness of breath ?Dermatological: negative for rash ?Respiratory: negative for cough or wheezing ?Urologic: negative for hematuria ?Abdominal: negative for nausea, vomiting, diarrhea, bright red blood per rectum, melena, or hematemesis ?Neurologic: negative for visual changes, syncope, or dizziness ?All other systems reviewed and are otherwise negative except as noted above. ? ? ? ?Blood pressure 125/80, pulse (!) 53, height '6\' 3"'$  (1.905 m), weight 231 lb 6.4 oz (105 kg), SpO2 95 %.  ?General appearance: alert and no distress ?Neck: no adenopathy, no carotid bruit, no JVD, supple, symmetrical, trachea midline, and thyroid not enlarged, symmetric, no tenderness/mass/nodules ?Lungs: clear to auscultation bilaterally ?Heart: regular rate and rhythm, S1, S2 normal, no murmur, click, rub or gallop ?Extremities: extremities normal, atraumatic, no cyanosis or edema ?Pulses: 2+ and symmetric ?Skin: Skin color, texture, turgor normal. No rashes or lesions ?Neurologic: Grossly normal ? ?EKG sinus bradycardia 53 without ST or T wave changes.  Personally reviewed this EKG. ? ?ASSESSMENT AND PLAN:  ? ?Elevated coronary artery calcium score ?Mr. Buchan was referred to me by Dr. Ardeth Perfect because of elevated coronary calcium score that was ordered 06/10/2020.  Total coronary calcium was 122 distributed in all 3 coronary arteries.  He is completely asymptomatic.  As result of this he was placed on low-dose statin therapy which apparently was effective.  He now complains of some atypical symptoms after drinking water in his axilla rating to his back.  While this does not necessarily sound ischemic I am going to get a coronary CTA to  further evaluate given his family history. ? ?Hyperlipidemia ?History of hyperlipidemia with lipid profile performed 05/20/2020 revealing total cholesterol 97, LDL 150 and HDL 31.  Based on this and his elevated coronary calcium score he was placed on low-dose rosuvastatin and is dramatically changed his diet.  Apparently more recent lipid profile has been significantly improved. ? ?Family history of heart disease ?His mother had myocardial infarction in her 54s.  His father had CABG at age 25. ? ? ? ? ?Lorretta Harp MD [facility] [place] ?09/01/2021 ?9:30 AM ?

## 2021-09-01 NOTE — Assessment & Plan Note (Signed)
History of hyperlipidemia with lipid profile performed 05/20/2020 revealing total cholesterol 97, LDL 150 and HDL 31.  Based on this and his elevated coronary calcium score he was placed on low-dose rosuvastatin and is dramatically changed his diet.  Apparently more recent lipid profile has been significantly improved. ?

## 2021-09-21 ENCOUNTER — Telehealth (HOSPITAL_COMMUNITY): Payer: Self-pay | Admitting: *Deleted

## 2021-09-21 NOTE — Telephone Encounter (Signed)
Reaching out to patient to offer assistance regarding upcoming cardiac imaging study; pt verbalizes understanding of appt date/time, parking situation and where to check in, pre-test NPO status  and verified current allergies; name and call back number provided for further questions should they arise ? ?Wyndell Cardiff RN Navigator Cardiac Imaging ?[facility] ?[phone] office ?[phone] cell ? ?

## 2021-09-22 ENCOUNTER — Ambulatory Visit (HOSPITAL_BASED_OUTPATIENT_CLINIC_OR_DEPARTMENT_OTHER)
Admission: RE | Admit: 2021-09-22 | Discharge: 2021-09-22 | Disposition: A | Discharge status: Home / Self Care | Payer: BC Managed Care – PPO | Source: Ambulatory Visit | Attending: Internal Medicine | Admitting: Internal Medicine

## 2021-09-22 ENCOUNTER — Ambulatory Visit (HOSPITAL_COMMUNITY)
Admission: RE | Admit: 2021-09-22 | Discharge: 2021-09-22 | Disposition: A | Discharge status: Home / Self Care | Payer: BC Managed Care – PPO | Source: Ambulatory Visit | Attending: Cardiovascular Disease | Admitting: Cardiovascular Disease

## 2021-09-22 DIAGNOSIS — Z8249 Family history of ischemic heart disease and other diseases of the circulatory system: Secondary | ICD-10-CM | POA: Diagnosis present

## 2021-09-22 DIAGNOSIS — E782 Mixed hyperlipidemia: Secondary | ICD-10-CM | POA: Diagnosis present

## 2021-09-22 DIAGNOSIS — I2510 Atherosclerotic heart disease of native coronary artery without angina pectoris: Secondary | ICD-10-CM | POA: Diagnosis not present

## 2021-09-22 DIAGNOSIS — R931 Abnormal findings on diagnostic imaging of heart and coronary circulation: Secondary | ICD-10-CM | POA: Insufficient documentation

## 2021-09-22 MED ORDER — NITROGLYCERIN 0.4 MG SL SUBL
SUBLINGUAL_TABLET | SUBLINGUAL | Status: AC
  Filled 2021-09-22: qty 2

## 2021-09-22 MED ORDER — NITROGLYCERIN 0.4 MG SL SUBL
0.8000 mg | SUBLINGUAL_TABLET | Freq: Once | SUBLINGUAL | Status: AC
  Administered 2021-09-22: 0.8 mg via SUBLINGUAL

## 2021-09-22 MED ORDER — IOHEXOL 350 MG/ML SOLN
100.0000 mL | Freq: Once | INTRAVENOUS | Status: AC | PRN
  Administered 2021-09-22: 100 mL via INTRAVENOUS

## 2022-04-17 ENCOUNTER — Ambulatory Visit: Payer: BC Managed Care – PPO | Admitting: Dermatology

## 2022-04-18 ENCOUNTER — Ambulatory Visit: Payer: BC Managed Care – PPO | Admitting: Dermatology

## 2022-08-21 ENCOUNTER — Other Ambulatory Visit (HOSPITAL_COMMUNITY): Payer: Self-pay | Admitting: Internal Medicine

## 2022-08-21 DIAGNOSIS — M5416 Radiculopathy, lumbar region: Secondary | ICD-10-CM

## 2022-08-24 ENCOUNTER — Ambulatory Visit (HOSPITAL_COMMUNITY): Payer: BC Managed Care – PPO

## 2022-09-06 NOTE — Progress Notes (Signed)
Cardiology Clinic Note   Patient Name: Ryan Skinner [facility] Date of Encounter: 09/07/2022  Primary Care Provider:  Runell Gess, MD Primary Cardiologist:  Nanetta Batty, MD  Patient Profile    52 year old male with known history of hyperlipidemia, family history of heart disease, coronary CTA on 09/22/2021 revealing no evidence of significant functional coronary artery stenosis per FFR.  He was found to have elevated PSA per PCP labs in April 2024, and has been referred to urology for further evaluation.  Past Medical History    Past Medical History:  Diagnosis Date   Hypertension    Past Surgical History:  Procedure Laterality Date   APPENDECTOMY     CYSTECTOMY     right wrist   HERNIA REPAIR      Allergies  No Known Allergies  History of Present Illness    Mr. Hoppens comes in today with complaints of dizziness and slow heart rate.  He works in Production designer, theatre/television/film and he states that he has had some issues with low heart rate when he checks it, with some associated fatigue.  He is currently under a lot of stress concerning following up with urology with elevated PSA.  He admits to urinary frequency especially during the nighttime.  Heart rate is low when he is awakening to urinate.  He is not on any rate reducing medications.  He has stopped taking losartan HCTZ as it was not affecting his blood pressure positively, and causing him to urinate more frequently as well.  Home Medications    Current Outpatient Medications  Medication Sig Dispense Refill   hydrocortisone 2.5 % cream Apply topically 2 (two) times daily as needed (Rash). 30 g 5   losartan-hydrochlorothiazide (HYZAAR) 100-25 MG tablet Take 1 tablet by mouth daily.     rosuvastatin (CRESTOR) 5 MG tablet Take 5 mg by mouth at bedtime.     metoprolol tartrate (LOPRESSOR) 25 MG tablet Take 1 tablet (25 mg total) by mouth once for 1 dose. Take 2 hours prior to procedure. 1 tablet 0   No current facility-administered  medications for this visit.     Family History    Family History  Problem Relation Age of Onset   Heart disease Mother    Hypertension Father    Diabetes Father    Prostate cancer Paternal Uncle    Colon cancer Neg Hx    Esophageal cancer Neg Hx    Rectal cancer Neg Hx    Stomach cancer Neg Hx    He indicated that his mother is alive. He indicated that his father is alive. He indicated that the status of his paternal uncle is unknown. He indicated that the status of his neg hx is unknown.  Social History    Social History   Socioeconomic History   Marital status: Married    Spouse name: Not on file   Number of children: Not on file   Years of education: Not on file   Highest education level: Not on file  Occupational History   Not on file  Tobacco Use   Smoking status: Never   Smokeless tobacco: Never  Substance and Sexual Activity   Alcohol use: Yes    Comment: occasional   Drug use: No   Sexual activity: Not on file  Other Topics Concern   Not on file  Social History Narrative   Not on file   Social Determinants of Health   Financial Resource Strain: Not on file  Food Insecurity: Not on  file  Transportation Needs: Not on file  Physical Activity: Not on file  Stress: Not on file  Social Connections: Not on file  Intimate Partner Violence: Not on file     Review of Systems    General:  No chills, fever, night sweats or weight changes.  Fatigue  Cardiovascular:  No chest pain, dyspnea on exertion, edema, orthopnea, palpitations, paroxysmal nocturnal dyspnea. Dermatological: No rash, lesions/masses Respiratory: No cough, dyspnea Urologic: No hematuria, dysuria, frequent urination. Abdominal:   No nausea, vomiting, diarrhea, bright red blood per rectum, melena, or hematemesis Neurologic:  No visual changes, wkns, changes in mental status. All other systems reviewed and are otherwise negative except as noted above.     Physical Exam    VS:  BP (!)  146/98 (BP Location: Left Arm, Patient Position: Sitting, Cuff Size: Large)   Pulse (!) 48   Ht 6\' 3"  (1.905 m)   Wt 229 lb 6.4 oz (104.1 kg)   SpO2 97%   BMI 28.67 kg/m  , BMI Body mass index is 28.67 kg/m.     GEN: Well nourished, well developed, in no acute distress. HEENT: normal. Neck: Supple, no JVD, carotid bruits, or masses. Cardiac: RRR, bradycardic, no murmurs, rubs, or gallops. No clubbing, cyanosis, edema.  Radials/DP/PT 2+ and equal bilaterally.  Respiratory:  Respirations regular and unlabored, clear to auscultation bilaterally. GI: Soft, nontender, nondistended, BS + x 4. MS: no deformity or atrophy. Skin: warm and dry, no rash. Neuro:  Strength and sensation are intact. Psych: Normal affect.  Accessory Clinical Findings    ECG personally reviewed by me today-sinus bradycardia heart rate 45 bpm otherwise normal.- No acute changes  Lab Results  Component Value Date   WBC 7.5 03/05/2018   HGB 15.7 03/05/2018   HCT 45.8 03/05/2018   MCV 86.3 03/05/2018   PLT 187 03/05/2018   Lab Results  Component Value Date   CREATININE 0.82 09/01/2021   BUN 19 09/01/2021   NA 139 09/01/2021   K 4.1 09/01/2021   CL 100 09/01/2021   CO2 28 09/01/2021   Lab Results  Component Value Date   ALT 15 10/23/2010   AST 16 10/23/2010   ALKPHOS 53 10/23/2010   BILITOT 0.5 10/23/2010   No results found for: "CHOL", "HDL", "LDLCALC", "LDLDIRECT", "TRIG", "CHOLHDL"  No results found for: "HGBA1C"  Review of Prior Studies: CT-FFR analysis was performed on the original cardiac CT angiogram dataset. Diagrammatic representation of the CT-FFR analysis is provided in a separate PDF document in PACS. This dictation was created using the PDF document and an interactive 3D model of the results. 3D model is not available in the EMR/PACS. Normal FFR range is >0.80.   1. Left Main: No significant functional stenosis, CT-FFR 0.94->0.90 at left main.   2. LAD: No significant functional  stenosis, CT-FFR 0.88 and mid LAD lesion, 0.75 distal vessel without discrete stenosis. 3. LCX: No significant functional stenosis, CT-FFR 0.89 ostial LCX. 4. RCA: No significant functional stenosis, CT-FFR 0.98 RCA lesion.   IMPRESSION: 1. CT FFR analysis shows no evidence of significant functional stenosis.  Assessment & Plan   1.  Bradycardia: Somewhat symptomatic as he has some fatigue.  He denies any dizziness, chest pain, near syncope.  I will place a 7-day ZIO monitor constant surveillance to evaluate for significant bradycardia or pauses.  He is aware that he can also report any symptoms.  2.  Hypertension: The patient is very nervous and stressed about follow-up appointment with  urologist later today.  Will have to keep track of his blood pressure.  Despite bradycardia blood pressure is elevated.  I did recheck it and was 142/86.  May need to place him back just on losartan for now possibly at 50 mg daily but would like to follow-up with them first.    3. Elevated PSA: To follow-up with urologist in a couple of hours for further evaluation and testing.   Signed, Bettey Mare. Liborio Nixon, ANP, AACC   09/07/2022 10:56 AM      Office [phone] Fax [phone]  Notice: This dictation was prepared with Dragon dictation along with smaller phrase technology. Any transcriptional errors that result from this process are unintentional and may not be corrected upon review.

## 2022-09-07 ENCOUNTER — Ambulatory Visit: Payer: BC Managed Care – PPO | Attending: Adult Health | Admitting: Adult Health

## 2022-09-07 ENCOUNTER — Ambulatory Visit: Payer: BC Managed Care – PPO | Attending: Adult Health

## 2022-09-07 ENCOUNTER — Encounter: Payer: Self-pay | Admitting: Adult Health

## 2022-09-07 VITALS — BP 146/98 | HR 48 | Ht 75.0 in | Wt 229.4 lb

## 2022-09-07 DIAGNOSIS — I495 Sick sinus syndrome: Secondary | ICD-10-CM

## 2022-09-07 NOTE — Patient Instructions (Addendum)
Medication Instructions:  No Changes *If you need a refill on your cardiac medications before your next appointment, please call your pharmacy*   Lab Work: No Labs If you have labs (blood work) drawn today and your tests are completely normal, you will receive your results only by: [place] (if you have MyChart) OR A paper copy in the mail If you have any lab test that is abnormal or we need to change your treatment, we will call you to review the results.   Testing/Procedures: ZIO AT Long term monitor-Live Telemetry  Your physician has requested you wear a ZIO patch monitor for 7 days.  This is a single patch monitor. [employer] supplies one patch monitor per enrollment. Additional  stickers are not available.  Please do not apply patch if you will be having a Nuclear Stress Test, Echocardiogram, Cardiac CT, MRI,  or Chest Xray during the period you would be wearing the monitor. The patch cannot be worn during  these tests. You cannot remove and re-apply the ZIO AT patch monitor.  Your ZIO patch monitor will be mailed 3 day USPS to your address on file. It may take 3-5 days to  receive your monitor after you have been enrolled.  Once you have received your monitor, please review the enclosed instructions. Your monitor has  already been registered assigning a specific monitor serial # to you.   Billing and Patient Assistance Program information  Ryan Skinner has been supplied with any insurance information on record for billing. [employer] offers a sliding scale Patient Assistance Program for patients without insurance, or whose  insurance does not completely cover the cost of the ZIO patch monitor. You must apply for the  Patient Assistance Program to qualify for the discounted rate. To apply, call [employer] at [phone],  select option 4, select option 2 , ask to apply for the Patient Assistance Program, (you can request an  interpreter if needed). [employer] will ask your household  income and how many people are in your  household. [employer] will quote your out-of-pocket cost based on this information. They will also be able  to set up a 12 month interest free payment plan if needed.  Applying the monitor   Shave hair from upper left chest.  Hold the abrader disc by orange tab. Rub the abrader in 40 strokes over left upper chest as indicated in  your monitor instructions.  Clean area with 4 enclosed alcohol pads. Use all pads to ensure the area is cleaned thoroughly. Let  dry.  Apply patch as indicated in monitor instructions. Patch will be placed under collarbone on left side of  chest with arrow pointing upward.  Rub patch adhesive wings for 2 minutes. Remove the white label marked "1". Remove the white label  marked "2". Rub patch adhesive wings for 2 additional minutes.  While looking in a mirror, press and release button in center of patch. A small green light will flash 3-4  times. This will be your only indicator that the monitor has been turned on.  Do not shower for the first 24 hours. You may shower after the first 24 hours.  Press the button if you feel a symptom. You will hear a small click. Record Date, Time and Symptom in  the Patient Log.   Starting the Gateway  In your kit there is a Hydrographic surveyor box the size of a cellphone. This is Airline pilot. It transmits all your  recorded data to [facility]. This box must  always stay within 10 feet of you. Open the box and push the *  button. There will be a light that blinks orange and then green a few times. When the light stops  blinking, the Gateway is connected to the ZIO patch. Call [employer] at [phone] to confirm your monitor is transmitting.  Returning your monitor  Remove your patch and place it inside the Gateway. In the lower half of the Gateway there is a white  bag with prepaid postage on it. Place Gateway in bag and seal. Mail package back to [place] as soon as  possible. Your physician should  have your final report approximately 7 days after you have mailed back  your monitor. Call [facility] Customer Care at [phone] if you have questions regarding your ZIO AT  patch monitor. Call them immediately if you see an orange light blinking on your monitor.  If your monitor falls off in less than 4 days, contact our Monitor department at [phone]. If your  monitor becomes loose or falls off after 4 days call [employer] at [phone] for suggestions on  securing your monitor    Follow-Up: At [facility], you and your health needs are our priority.  As part of our continuing mission to provide you with exceptional heart care, we have created designated Provider Care Teams.  These Care Teams include your primary Cardiologist (physician) and Advanced Practice Providers (APPs -  Physician Assistants and Nurse Practitioners) who all work together to provide you with the care you need, when you need it.  We recommend signing up for the patient portal called "MyChart".  Sign up information is provided on this After Visit Summary.  MyChart is used to connect with patients for Virtual Visits (Telemedicine).  Patients are able to view lab/test results, encounter notes, upcoming appointments, etc.  Non-urgent messages can be sent to your provider as well.   To learn more about what you can do with MyChart, go to [email or web address].    Your next appointment:   1 month(s)  Provider:   Joni Reining, DNP, ANP

## 2022-09-07 NOTE — Progress Notes (Unsigned)
Enrolled patient for a 7 day Zio XT monitor to be mailed to patients home  Dr Berry to read 

## 2022-09-15 DIAGNOSIS — I495 Sick sinus syndrome: Secondary | ICD-10-CM

## 2022-09-28 ENCOUNTER — Ambulatory Visit (HOSPITAL_COMMUNITY): Payer: BC Managed Care – PPO

## 2022-09-28 ENCOUNTER — Encounter (HOSPITAL_COMMUNITY): Payer: Self-pay

## 2022-10-10 NOTE — Progress Notes (Signed)
Cardiology Clinic Note   Patient Name: Ryan Skinner [facility] Date of Encounter: 10/19/2022  Primary Care Provider:  Alysia Penna, MD Primary Cardiologist:  Ryan Batty, MD  Patient Profile    52 year old male with known history of hyperlipidemia, family history of heart disease, coronary CTA on 09/22/2021 revealing no evidence of significant functional coronary artery stenosis per FFR.  He was found to have elevated PSA per PCP labs in April 2024, and has been referred to urology for further evaluation.  On last office visit the patient was complaining of dizziness fatigue and low heart rate.  ZIO monitor was placed.    Past Medical History    Past Medical History:  Diagnosis Date   Hypertension    Past Surgical History:  Procedure Laterality Date   APPENDECTOMY     CYSTECTOMY     right wrist   HERNIA REPAIR      Allergies  No Known Allergies  History of Present Illness    Ryan Skinner returns today for ongoing assessment and management of hypertension, hyperlipidemia, with complaints of dizziness and low heart rate with associated fatigue.  Patient had a ZIO monitor placed with results on 09/07/2022 revealing minimum heart rate of 37 bpm with a maximum heart rate of 176 bpm with average heart rate of 63 bpm.  He had 42 ventricular tachycardia runs with the fastest interval lasting 4 beats with a maximum rate of 176 bpm, longest lasting 16 beats with an average of 109 bpm.  He comes today still feeling fluttering and palpitations.  He also admits to snoring but is uncertain if he stops breathing.  He is also being followed by urologist for ED symptoms and has been placed on Cialis.  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  Sometimes he is at work at 4 AM and other x 5 or 6 AM.  He usually goes to sleep around 830 or 9 PM.  He denies any significant dizziness but he has some fatigue.  Home Medications    Current Outpatient Medications   Medication Sig Dispense Refill   hydrocortisone 2.5 % cream Apply topically 2 (two) times daily as needed (Rash). 30 g 5   metoprolol tartrate (LOPRESSOR) 25 MG tablet Take 1 tablet (25 mg total) by mouth 2 (two) times daily. 180 tablet 3   rosuvastatin (CRESTOR) 5 MG tablet Take 5 mg by mouth at bedtime.     tamsulosin (FLOMAX) 0.4 MG CAPS capsule Take 0.4 mg by mouth daily.     Vibegron 75 MG TABS Take 1 tablet by mouth daily.     losartan-hydrochlorothiazide (HYZAAR) 100-25 MG tablet Take 1 tablet by mouth daily. (Patient not taking: Reported on 10/19/2022)     No current facility-administered medications for this visit.     Family History    Family History  Problem Relation Age of Onset   Heart disease Mother    Hypertension Father    Diabetes Father    Prostate cancer Paternal Uncle    Colon cancer Neg Hx    Esophageal cancer Neg Hx    Rectal cancer Neg Hx    Stomach cancer Neg Hx    He indicated that his mother is alive. He indicated that his father is alive. He indicated that the status of his paternal uncle is unknown. He indicated that the status of his neg hx is unknown.  Social History    Social History   Socioeconomic History   Marital status:  Married    Spouse name: Not on file   Number of children: Not on file   Years of education: Not on file   Highest education level: Not on file  Occupational History   Not on file  Tobacco Use   Smoking status: Never   Smokeless tobacco: Never  Substance and Sexual Activity   Alcohol use: Yes    Comment: occasional   Drug use: No   Sexual activity: Not on file  Other Topics Concern   Not on file  Social History Narrative   Not on file   Social Determinants of Health   Financial Resource Strain: Not on file  Food Insecurity: Not on file  Transportation Needs: Not on file  Physical Activity: Not on file  Stress: Not on file  Social Connections: Not on file  Intimate Partner Violence: Not on file     Review  of Systems    General:  No chills, fever, night sweats or weight changes.  Cardiovascular:  No chest pain, dyspnea on exertion, edema, orthopnea, positive for palpitations, paroxysmal nocturnal dyspnea. Dermatological: No rash, lesions/masses Respiratory: No cough, dyspnea Urologic: No hematuria, dysuria Abdominal:   No nausea, vomiting, diarrhea, bright red blood per rectum, melena, or hematemesis Neurologic:  No visual changes, wkns, changes in mental status. All other systems reviewed and are otherwise negative except as noted above.     Physical Exam    VS:  BP (!) 148/96   Pulse (!) 47   Ht 6\' 3"  (1.905 m)   Wt 231 lb 9.6 oz (105.1 kg)   SpO2 98%   BMI 28.95 kg/m  , BMI Body mass index is 28.95 kg/m.     GEN: Well nourished, well developed, in no acute distress. HEENT: normal. Neck: Supple, no JVD, carotid bruits, or masses. Cardiac: RRR, no murmurs, rubs, or gallops. No clubbing, cyanosis, edema.  Radials/DP/PT 2+ and equal bilaterally.  Respiratory:  Respirations regular and unlabored, clear to auscultation bilaterally. GI: Soft, nontender, nondistended, BS + x 4. MS: no deformity or atrophy. Skin: warm and dry, no rash. Neuro:  Strength and sensation are intact. Psych: Normal affect.  Accessory Clinical Findings    ECG personally reviewed by me today-not completed this office visit.  Reviewed cardiac monitor results with patient.  Lab Results  Component Value Date   WBC 7.5 03/05/2018   HGB 15.7 03/05/2018   HCT 45.8 03/05/2018   MCV 86.3 03/05/2018   PLT 187 03/05/2018   Lab Results  Component Value Date   CREATININE 0.82 09/01/2021   BUN 19 09/01/2021   NA 139 09/01/2021   K 4.1 09/01/2021   CL 100 09/01/2021   CO2 28 09/01/2021   Lab Results  Component Value Date   ALT 15 10/23/2010   AST 16 10/23/2010   ALKPHOS 53 10/23/2010   BILITOT 0.5 10/23/2010   No results found for: "CHOL", "HDL", "LDLCALC", "LDLDIRECT", "TRIG", "CHOLHDL"  No results  found for: "HGBA1C"  Review of Prior Studies: Zio Monitor 09/07/2022 Patient had a min HR of 37 bpm, max HR of 176 bpm, and avg HR of 63 bpm. Predominant underlying rhythm was Sinus Rhythm. 42 Ventricular Tachycardia runs occurred, the run with the fastest interval lasting 4 beats with a max rate of 176 bpm, the longest  lasting 16 beats with an avg rate of 109 bpm. 1 run of Supraventricular Tachycardia occurred lasting 9 beats with a max rate of 126 bpm (avg 111 bpm). Isolated SVEs were rare (<  1.0%), and no SVE Couplets or SVE Triplets were present. Isolated VEs were  frequent (6.1%, [id number]), VE Couplets were rare (<1.0%, 2342), and VE Triplets were rare (<1.0%, 698). Ventricular Bigeminy and Trigeminy were present.   Assessment & Plan   1.  Frequent ventricular arrhythmias with SVT.  Plan to start metoprolol tartrate 25 mg twice daily, noted to have 42 ventricular tachycardia runs on ZIO monitor as described above with SVT.  I describes some side effects as fatigue.  He is to avoid large amounts of caffeine.  He states that he does drink a 20 ounce cup of coffee every morning and otherwise very little caffeine.  I will see him back in a month to evaluate his response to medication.  If he does not tolerate metoprolol may need to consider antiarrhythmic such as amiodarone. He will call us if he cannot tolerate the metoprolol.   2.  Rule out sleep apnea: Patient will have a home sleep study test to evaluate for sleep apnea as underlying cause of ventricular arrhythmias.  I have reviewed his labs potassium was normal.  Can consider checking TSH on follow-up.  3.  Hypertension: Remains on losartan HCTZ 100/25 mg daily.  Slightly elevated today.  Will monitor his response with addition of beta-blocker.  4.  Hypercholesterolemia: Continue rosuvastatin 5 mg at at bedtime.  Follow-up lipids LFTs and be met will be ordered on next office visit with goal of LDL less than 100.        Signed, Bettey Mare.  Liborio Nixon, ANP, AACC   10/19/2022 9:00 AM      Office [phone] Fax [phone]  Notice: This dictation was prepared with Dragon dictation along with smaller phrase technology. Any transcriptional errors that result from this process are unintentional and may not be corrected upon review.

## 2022-10-19 ENCOUNTER — Ambulatory Visit: Payer: BC Managed Care – PPO | Attending: Adult Health | Admitting: Adult Health

## 2022-10-19 ENCOUNTER — Telehealth: Payer: Self-pay

## 2022-10-19 ENCOUNTER — Encounter: Payer: Self-pay | Admitting: Adult Health

## 2022-10-19 VITALS — BP 148/96 | HR 47 | Ht 75.0 in | Wt 231.6 lb

## 2022-10-19 DIAGNOSIS — I471 Supraventricular tachycardia, unspecified: Secondary | ICD-10-CM

## 2022-10-19 DIAGNOSIS — G4739 Other sleep apnea: Secondary | ICD-10-CM

## 2022-10-19 DIAGNOSIS — Z8249 Family history of ischemic heart disease and other diseases of the circulatory system: Secondary | ICD-10-CM | POA: Diagnosis not present

## 2022-10-19 DIAGNOSIS — I472 Ventricular tachycardia, unspecified: Secondary | ICD-10-CM | POA: Diagnosis not present

## 2022-10-19 MED ORDER — METOPROLOL TARTRATE 25 MG PO TABS: 25.0000 mg | ORAL_TABLET | Freq: Two times a day (BID) | ORAL | 3 refills | Status: DC

## 2022-10-19 NOTE — Patient Instructions (Signed)
Medication Instructions:  Start Metoprolol Tartrate 25 mg ( Take 1 Tablet Twice Daily) *If you need a refill on your cardiac medications before your next appointment, please call your pharmacy*   Lab Work: None  If you have labs (blood work) drawn today and your tests are completely normal, you will receive your results only by: MyChart Message (if you have MyChart) OR A paper copy in the mail If you have any lab test that is abnormal or we need to change your treatment, we will call you to review the results.   Testing/Procedures: Your physician has recommended that you have a sleep study. This test records several body functions during sleep, including: brain activity, eye movement, oxygen and carbon dioxide blood levels, heart rate and rhythm, breathing rate and rhythm, the flow of air through your mouth and nose, snoring, body muscle movements, and chest and belly movement.    Follow-Up: At [facility] you and your health needs are our priority.  As part of our continuing mission to provide you with exceptional heart care, we have created designated Provider Care Teams.  These Care Teams include your primary Cardiologist (physician) and Advanced Practice Providers (APPs -  Physician Assistants and Nurse Practitioners) who all work together to provide you with the care you need, when you need it.  We recommend signing up for the patient portal called "MyChart".  Sign up information is provided on this After Visit Summary.  MyChart is used to connect with patients for Virtual Visits (Telemedicine).  Patients are able to view lab/test results, encounter notes, upcoming appointments, etc.  Non-urgent messages can be sent to your provider as well.   To learn more about what you can do with MyChart, go to [email or web address].    Your next appointment:   1 month(s)  Provider:   Joni Reining, DNP, ANP

## 2022-10-19 NOTE — Telephone Encounter (Addendum)
Results reviewed with patient at office visit on 10/19/22.----- Message from Jodelle Gross, NP sent at 10/14/2022  2:50 PM EDT ----- His monitor revealed fast heart rates, intermittently throughout the day. Average HR was 63 bpm, with frequent skipped beats.  Will need to start him on metoprolol tartrate 25 mg BID.  This will help to suppress all of the skipped beats and fast heart rates. The low HR's were because the skipped beats were not being counted in the total heart rate.  Keep up with the blood pressure on your monitor.  Let us know if your heart rate goes below 50 or your blood pressure is below 115/55.   KL

## 2022-10-25 NOTE — Addendum Note (Signed)
Addended by: Brunetta Genera on: 10/25/2022 03:21 PM   Modules accepted: Orders

## 2022-11-02 ENCOUNTER — Telehealth: Payer: Self-pay | Admitting: Cardiovascular Disease

## 2022-11-02 NOTE — Telephone Encounter (Signed)
Pt was returning LPN call and is now requesting a callback. Please advise

## 2022-11-02 NOTE — Telephone Encounter (Signed)
Call to patient. LVM to call office 

## 2022-11-02 NOTE — Telephone Encounter (Signed)
Patient was told he would need a sleep apnea test done and would receive call. Requesting call back to discuss.

## 2022-11-02 NOTE — Telephone Encounter (Signed)
Patient calling stating he was supposed to be contacted about sleep apnea test and it has been two weeks and he has not received a call. He also stated it was supposed to be done before his next follow up appointment which is 11/30/22.

## 2022-11-07 NOTE — Telephone Encounter (Signed)
Left VM, per DPR, notifying patient that we are delayed with getting sleep studies scheduled, left call back number for any questions, and apologized for the delay in services.

## 2022-11-30 ENCOUNTER — Ambulatory Visit: Payer: BC Managed Care – PPO | Admitting: Adult Health

## 2022-12-04 NOTE — Telephone Encounter (Signed)
Patient wants to have sleep study test done before his OV on 8/30.

## 2022-12-24 ENCOUNTER — Telehealth: Payer: Self-pay | Admitting: Cardiovascular Disease

## 2022-12-24 NOTE — Telephone Encounter (Signed)
See previous encounter.  Patient is following up. He cancelled 8/30 appointment and rescheduled for 10/04 because he wanted to have sleep apnea appointment prior but he still hasn't heard back with any updates. Please advise.

## 2022-12-24 NOTE — Telephone Encounter (Signed)
Called three times and rings busy.

## 2022-12-25 NOTE — Telephone Encounter (Signed)
Called pt and phone rings busy immediately.

## 2022-12-28 ENCOUNTER — Ambulatory Visit: Payer: BC Managed Care – PPO | Admitting: Adult Health

## 2023-02-01 ENCOUNTER — Ambulatory Visit: Payer: BC Managed Care – PPO | Attending: Adult Health | Admitting: Nurse Practitioner

## 2023-02-01 ENCOUNTER — Encounter: Payer: Self-pay | Admitting: Nurse Practitioner

## 2023-02-01 VITALS — BP 132/84 | HR 48 | Ht 75.0 in | Wt 236.6 lb

## 2023-02-01 DIAGNOSIS — I472 Ventricular tachycardia, unspecified: Secondary | ICD-10-CM

## 2023-02-01 DIAGNOSIS — I10 Essential (primary) hypertension: Secondary | ICD-10-CM

## 2023-02-01 DIAGNOSIS — G4739 Other sleep apnea: Secondary | ICD-10-CM

## 2023-02-01 DIAGNOSIS — I2510 Atherosclerotic heart disease of native coronary artery without angina pectoris: Secondary | ICD-10-CM

## 2023-02-01 DIAGNOSIS — E785 Hyperlipidemia, unspecified: Secondary | ICD-10-CM

## 2023-02-01 DIAGNOSIS — I471 Supraventricular tachycardia, unspecified: Secondary | ICD-10-CM

## 2023-02-01 DIAGNOSIS — R001 Bradycardia, unspecified: Secondary | ICD-10-CM | POA: Diagnosis not present

## 2023-02-01 NOTE — Patient Instructions (Addendum)
Medication Instructions:  Metoprolol Tartrate take 12.5 mg in the morning and 25 mg in the evening.   *If you need a refill on your cardiac medications before your next appointment, please call your pharmacy*   Lab Work: NONE ordered at this time of appointment     Testing/Procedures: NONE ordered at this time of appointment     Follow-Up: At [facility], you and your health needs are our priority.  As part of our continuing mission to provide you with exceptional heart care, we have created designated Provider Care Teams.  These Care Teams include your primary Cardiologist (physician) and Advanced Practice Providers (APPs -  Physician Assistants and Nurse Practitioners) who all work together to provide you with the care you need, when you need it.  We recommend signing up for the patient portal called "MyChart".  Sign up information is provided on this After Visit Summary.  MyChart is used to connect with patients for Virtual Visits (Telemedicine).  Patients are able to view lab/test results, encounter notes, upcoming appointments, etc.  Non-urgent messages can be sent to your provider as well.   To learn more about what you can do with MyChart, go to [email or web address].    Your next appointment:   6 month(s)  Provider:   Nanetta Batty, MD     Other Instructions Report Heart rate consistently less than 50 beats per min.

## 2023-02-01 NOTE — Progress Notes (Signed)
Office Visit    Patient Name: Ryan Skinner Date of Encounter: 02/01/2023  Primary Care Provider:  Alysia Penna, MD Primary Cardiologist:  Nanetta Batty, MD  Chief Complaint    52 year old male with a history of mild nonobstructive CAD, NSVT, PSVT, possible OSA, hypertension, and hyperlipidemia who presents for follow-up related to palpitations.  Past Medical History    Past Medical History:  Diagnosis Date   Hypertension    Past Surgical History:  Procedure Laterality Date   APPENDECTOMY     CYSTECTOMY     right wrist   HERNIA REPAIR      Allergies  No Known Allergies   Labs/Other Studies Reviewed    The following studies were reviewed today:  Cardiac Studies & Procedures         MONITORS  LONG TERM MONITOR (3-14 DAYS) 10/03/2022  Narrative Patch Wear Time:  6 days and 18 hours (2024-05-18T16:22:50-0400 to 2024-05-25T11:19:32-0400)  Patient had a min HR of 37 bpm, max HR of 176 bpm, and avg HR of 63 bpm. Predominant underlying rhythm was Sinus Rhythm. 42 Ventricular Tachycardia runs occurred, the run with the fastest interval lasting 4 beats with a max rate of 176 bpm, the longest lasting 16 beats with an avg rate of 109 bpm. 1 run of Supraventricular Tachycardia occurred lasting 9 beats with a max rate of 126 bpm (avg 111 bpm). Isolated SVEs were rare (<1.0%), and no SVE Couplets or SVE Triplets were present. Isolated VEs were frequent (6.1%, [id number]), VE Couplets were rare (<1.0%, 2342), and VE Triplets were rare (<1.0%, 698). Ventricular Bigeminy and Trigeminy were present.  SR/SB/St Short runs of SVT and NSVT Freq PVCs9 6.1% burden) ROV to discuss   CT SCANS  CT CORONARY MORPH W/CTA COR W/SCORE 09/22/2021  Addendum 09/22/2021 11:51 AM ADDENDUM REPORT: 09/22/2021 11:49  ADDENDUM: Calcium scoring percentile scoring clarified:  For 52 year old white Male.  Score is 121.  87th percentile   Electronically Signed By: Riley Lam  M.D. On: 09/22/2021 11:49  Addendum 09/22/2021 11:44 AM ADDENDUM REPORT: 09/22/2021 11:42  CLINICAL DATA:  52 Year old White Male  EXAM: Cardiac/Coronary  CTA  TECHNIQUE: The patient was scanned on a [employer].  FINDINGS: Scan was triggered in the descending thoracic aorta. Axial non-contrast 3 mm slices were carried out through the heart. The data set was analyzed on a dedicated work station. 0.8 mg of sl NTG was given. Flash Sequence. Diastolic phases were analyzed on a dedicated work station using MPR, MIP and VRT modes. The patient received 100 cc of contrast.  Coronary Arteries:  Normal coronary origin.  Right dominance.  Coronary Calcium Score:  Left main: 28  Left anterior descending artery: 6  Left circumflex artery: 14  Right coronary artery: 73  Total: 121  Percentile: 87th for age, sex, and race matched control.  RCA is a large dominant artery that gives rise to PDA and PLA. Mild non-obstructive mixed plaque (25-49%) in proximal RCA. Mild non-obstructive mixed plaque (25-49%) in mid RCA.  Left main is a large artery that gives rise to LAD and LCX arteries. Mild non-obstructive mixed plaque (25-49%) in distal vessel.  LAD is a large vessel that gives rise to one large D1 Branch. Minimal non-obstructive calcified plaque (1-24%) in mid LAD.  LCX is a non-dominant artery. Mild non-obstructive calcified plaque (25-49%) in ostial vessel.  Other findings:  Aorta: Normal size.  No calcifications.  No dissection.  Main Pulmonary Artery: Dilation of  the main pulmonary artery: Mild to moderate at 31 mm but with trunk to aorta ratio of 0.96.  Aortic Valve:  Tri-leaflet.  No calcifications.  Normal pulmonary vein drainage into the left atrium.  Normal left atrial appendage without a thrombus.  Interatrial septum without notable communication.  Extra-cardiac findings: See attached radiology report for non-cardiac  structures.  IMPRESSION: 1. Coronary calcium score of 121. This was 87th percentile for age, sex, and race matched control.  2. Normal coronary origin with right dominance.  3. Dilation of the main pulmonary artery: Mild to moderate at 31 mm but with trunk to aorta ratio of 0.96. This can be associated with the presence of pulmonary hypertension; clinical correlation advised.  4. CAD-RADS 2. Mild non-obstructive CAD (25-49%). Consider non-atherosclerotic causes of chest pain. Consider preventive therapy and risk factor modification. FFR sent for Left main evaluation.  RECOMMENDATIONS:  Coronary artery calcium (CAC) score is a strong predictor of incident coronary heart disease (CHD) and provides predictive information beyond traditional risk factors. CAC scoring is reasonable to use in the decision to withhold, postpone, or initiate statin therapy in intermediate-risk or selected borderline-risk asymptomatic adults (age 46-75 years and LDL-C >=70 to <190 mg/dL) who do not have diabetes or established atherosclerotic cardiovascular disease (ASCVD).* In intermediate-risk (10-year ASCVD risk >=7.5% to <20%) adults or selected borderline-risk (10-year ASCVD risk >=5% to <7.5%) adults in whom a CAC score is measured for the purpose of making a treatment decision the following recommendations have been made:  If CAC = 0, it is reasonable to withhold statin therapy and reassess in 5 to 10 years, as long as higher risk conditions are absent (diabetes mellitus, family history of premature CHD in first degree relatives (males <55 years; females <65 years), cigarette smoking, LDL >=190 mg/dL or other independent risk factors).  If CAC is 1 to 99, it is reasonable to initiate statin therapy for patients >=36 years of age.  If CAC is >=100 or >=75th percentile, it is reasonable to initiate statin therapy at any age.  Cardiology referral should be considered for patients with  CAC scores =400 or >=75th percentile.  *2018 AHA/ACC/AACVPR/AAPA/ABC/ACPM/ADA/AGS/APhA/ASPC/NLA/PCNA Guideline on the Management of Blood Cholesterol: A Report of the American College of Cardiology/American Heart Association Task Force on Clinical Practice Guidelines. J Am Coll Cardiol. 2019;73(24):3168-3209.  Riley Lam, MD   Electronically Signed By: Riley Lam M.D. On: 09/22/2021 11:42  Narrative EXAM: OVER-READ INTERPRETATION  CT CHEST  The following report is a limited chest CT over-read performed by radiologist Dr. Caprice Renshaw of [facility] on 09/22/2021. This over-read does not include interpretation of cardiac or coronary anatomy or pathology. The coronary CTA interpretation by the cardiologist is attached.  COMPARISON:  None Available.  FINDINGS: Vascular: Coronary calcifications. No significant stroke cardiac vascular findings.  Mediastinum/Nodes: No lymphadenopathy.  Lungs/Pleura: Lingular scarring/subsegmental atelectasis. No focal airspace consolidation. No suspicious pulmonary nodules.  Upper Abdomen: No acute abnormality.  Musculoskeletal: No acute osseous abnormality. No suspicious lytic or blastic lesions.  IMPRESSION: No acute or significant incidental extracardiac findings in the chest. Interpretation of cardiac/coronary anatomy by cardiology to follow.  Electronically Signed: By: Caprice Renshaw M.D. On: 09/22/2021 10:05         Recent Labs: No results found for requested labs within last 365 days.  Recent Lipid Panel No results found for: "CHOL", "TRIG", "HDL", "CHOLHDL", "VLDL", "LDLCALC", "LDLDIRECT"  History of Present Illness    52 year old male with the above past medical history including  mild nonobstructive CAD, NSVT, PSVT, possible OSA, hypertension, and hyperlipidemia.    Coronary CT angiogram in 08/2021 revealed coronary calcium score 121 (87 percentile), negative FFR, mild nonobstructive CAD.  At  a follow-up visit he complained of dizziness, fatigue, low heart rate.  ZIO monitor in 08/2022 revealed predominantly sinus rhythm, 42 runs of ventricular tachycardia, longest lasting 16 beats, 1 run of PSVT, isolated SVE's and VE's.  He was last seen in office on 10/19/2022 and noted some mild fatigue.  He was started on metoprolol.  He was referred for sleep study open concern for sleep apnea.  He presents today for follow-up.  Since his last visit is been stable from a cardiac standpoint.  He has noted 1 or 2 episodes of mild chest discomfort with exertion.  His symptoms lasted for less than 5 minutes and resolved spontaneously.  He notes he has had intermittent chest discomfort like this since the time of his coronary CT angiogram last May.  Overall, his symptoms have been stable.  He notes that his palpitations have improved significantly since starting metoprolol.  If he misses a dose of his medication he will notice increased palpitations.  He has still not heard about scheduling his sleep study.    Home Medications    Current Outpatient Medications  Medication Sig Dispense Refill   hydrocortisone 2.5 % cream Apply topically 2 (two) times daily as needed (Rash). 30 g 5   losartan-hydrochlorothiazide (HYZAAR) 100-25 MG tablet Take 1 tablet by mouth daily.     rosuvastatin (CRESTOR) 5 MG tablet Take 5 mg by mouth at bedtime.     tamsulosin (FLOMAX) 0.4 MG CAPS capsule Take 0.4 mg by mouth daily.     Vibegron 75 MG TABS Take 1 tablet by mouth daily.     metoprolol tartrate (LOPRESSOR) 25 MG tablet Take 1 tablet (25 mg total) by mouth 2 (two) times daily. (Patient taking differently: Take by mouth 2 (two) times daily.  take 12.5 mg in the morning and 25 mg in the evening.) 180 tablet 3   No current facility-administered medications for this visit.     Review of Systems    He denies dyspnea, pnd, orthopnea, n, v, dizziness, syncope, edema, weight gain, or early satiety. All other systems reviewed  and are otherwise negative except as noted above.   Physical Exam    VS:  BP 132/84   Pulse (!) 48   Ht 6\' 3"  (1.905 m)   Wt 236 lb 9.6 oz (107.3 kg)   SpO2 98%   BMI 29.57 kg/m  `  GEN: Well nourished, well developed, in no acute distress. HEENT: normal. Neck: Supple, no JVD, carotid bruits, or masses. Cardiac: RRR, no murmurs, rubs, or gallops. No clubbing, cyanosis, edema.  Radials/DP/PT 2+ and equal bilaterally.  Respiratory:  Respirations regular and unlabored, clear to auscultation bilaterally. GI: Soft, nontender, nondistended, BS + x 4. MS: no deformity or atrophy. Skin: warm and dry, no rash. Neuro:  Strength and sensation are intact. Psych: Normal affect.  Accessory Clinical Findings    ECG personally reviewed by me today - EKG Interpretation Date/Time:  Friday February 01 2023 10:15:26 EDT Ventricular Rate:  44 PR Interval:  148 QRS Duration:  86 QT Interval:  472 QTC Calculation: 403 R Axis:   37  Text Interpretation: Marked sinus bradycardia When compared with ECG of 05-Mar-2018 11:29, PREVIOUS ECG IS PRESENT Confirmed by Bernadene Person ([id number]) on 02/01/2023 10:16:27 AM  - no acute changes.  Lab Results  Component Value Date   WBC 7.5 03/05/2018   HGB 15.7 03/05/2018   HCT 45.8 03/05/2018   MCV 86.3 03/05/2018   PLT 187 03/05/2018   Lab Results  Component Value Date   CREATININE 0.82 09/01/2021   BUN 19 09/01/2021   NA 139 09/01/2021   K 4.1 09/01/2021   CL 100 09/01/2021   CO2 28 09/01/2021   Lab Results  Component Value Date   ALT 15 10/23/2010   AST 16 10/23/2010   ALKPHOS 53 10/23/2010   BILITOT 0.5 10/23/2010   No results found for: "CHOL", "HDL", "LDLCALC", "LDLDIRECT", "TRIG", "CHOLHDL"  No results found for: "HGBA1C"  Assessment & Plan    1. CAD: Coronary CT angiogram in 08/2021 revealed coronary calcium score 121 (87 percentile), negative FFR, mild nonobstructive CAD. He has noted 1 or 2 episodes of mild chest discomfort with exertion.   His symptoms lasted for less than 5 minutes and resolved spontaneously.  He notes he has had intermittent chest discomfort like this since the time of his coronary CT angiogram last May.  Continue to monitor for progressive symptoms, no indication for ischemic evaluation at this time.  Continue losartan-hydrochlorothiazide, metoprolol, Crestor.  2. NSVT/PSVT/bradycardia: He has a history of sinus bradycardia.  ZIO monitor in 08/2022 revealed predominantly sinus rhythm, 42 runs of ventricular tachycardia, longest lasting 16 beats, 1 run of PSVT, isolated SVE's and VE's.  He was started on metoprolol. EKG today shows marked sinus bradycardia, 44 bpm.  Fortunately he is asymptomatic.  He is hesitant to decrease metoprolol, however, he is agreeable to decrease his morning dose to 12.5 mg and continue 25 mg of metoprolol tartrate in the evenings.  Continue to monitor HR and report HR consistently less than 50 bpm.     3. Possible OSA: He was referred for sleep study in June 2024. He has still not heard about scheduling his sleep study. I reached out to our sleep coordinator today who came to speak with the patient.  He is still pending prior authorization which will hopefully be completed today and he will be able to pick up his home sleep test.  4. Hypertension: BP well controlled. Continue current antihypertensive regimen.   5. Hyperlipidemia: LDL was 88 in 06/2021. Monitored per PCP.  Continue Crestor.  6. Disposition: Will plan for follow-up in 6 months, however, he will consider following up sooner and will notify us if he wishes to schedule an appointment.      Joylene Grapes, NP 02/01/2023, 1:29 PM

## 2023-02-08 ENCOUNTER — Telehealth: Payer: Self-pay | Admitting: Cardiovascular Disease

## 2023-02-08 NOTE — Telephone Encounter (Signed)
Patient was told at his 10/04 appt that he would be getting a call this week in regards to the in home sleep study. Requesting call back to discuss this further.

## 2023-02-12 NOTE — Telephone Encounter (Signed)
The patient has been notified about her HST. Left detailed message on voicemail and informed patient to call back. Latrelle Dodrill, CMA.

## 2023-02-15 IMAGING — CT CT HEART MORP W/ CTA COR W/ SCORE W/ CA W/CM &/OR W/O CM
2 of 5 series · 9 of 20 positions shown, 11 images · IV contrast (APPLIED)
Comparison: None Available.
COMPARISON: None Available.
COMPARISON: None Available.

Addendum:
EXAM:
OVER-READ INTERPRETATION  CT CHEST

The following report is a limited chest CT over-read performed by
This over-read does not include interpretation of cardiac or
coronary anatomy or pathology. The coronary CTA interpretation by
the cardiologist is attached.
CLINICAL DATA: 51 Year old White Female
Cardiac/Coronary  CTA
TECHNIQUE: The patient was scanned on a [employer] scanner.

[Series 6: fl_corcta 0.6 bv36 3 65% · axial · 0.44mm/px · z∈[-240,-160]mm · 6 of 282 slices shown, 8 images]
[im 41/282  vessel]
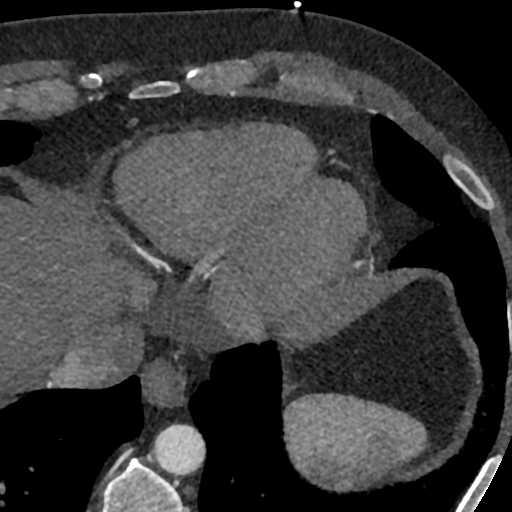
[im 41/282  lung]
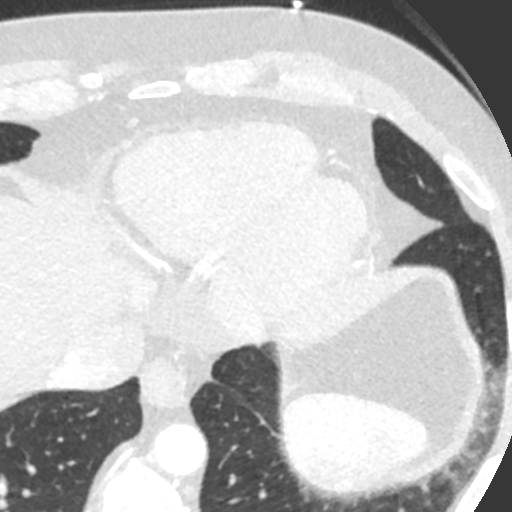
[im 81/282  vessel]
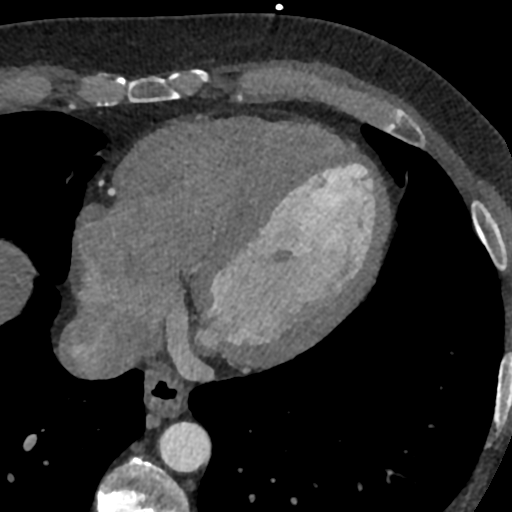
[im 121/282  vessel]
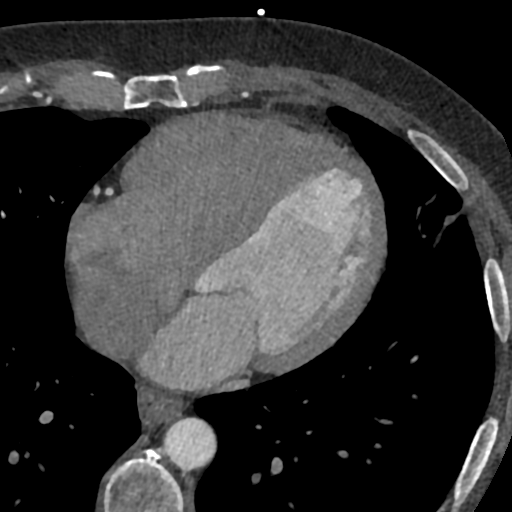
[im 161/282  vessel]
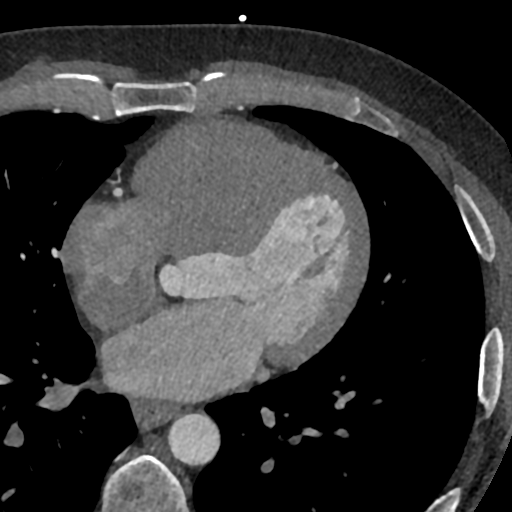
[im 201/282  vessel]
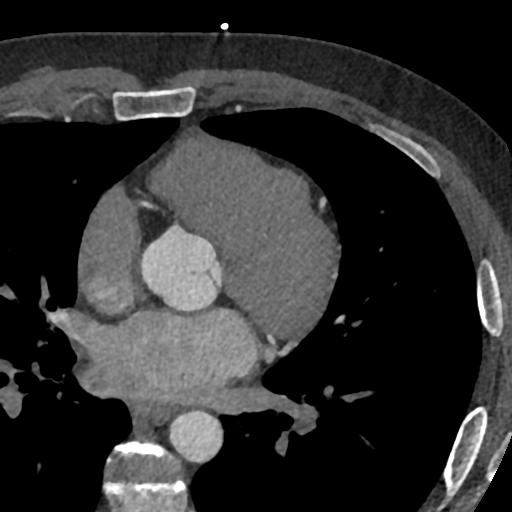
[im 201/282  lung]
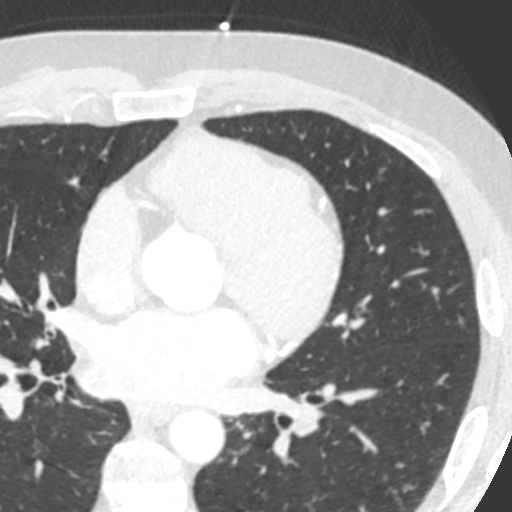
[im 241/282  vessel]
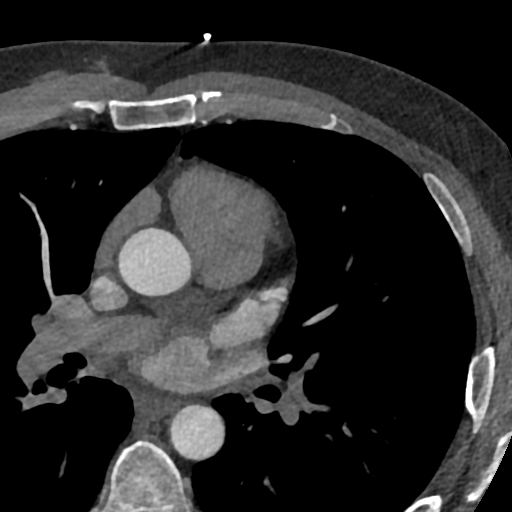

[Series 7: fl_corcta 0.6 bv44 3 65% · axial · 0.44mm/px · z∈[-240,-208]mm · 3 of 282 slices shown]
[im 41/282  vessel]
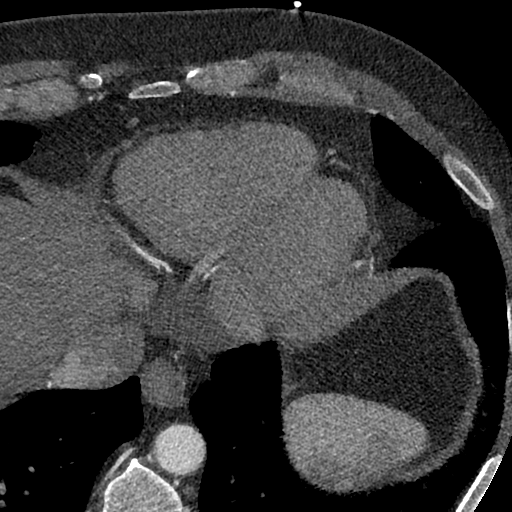
[im 81/282  vessel]
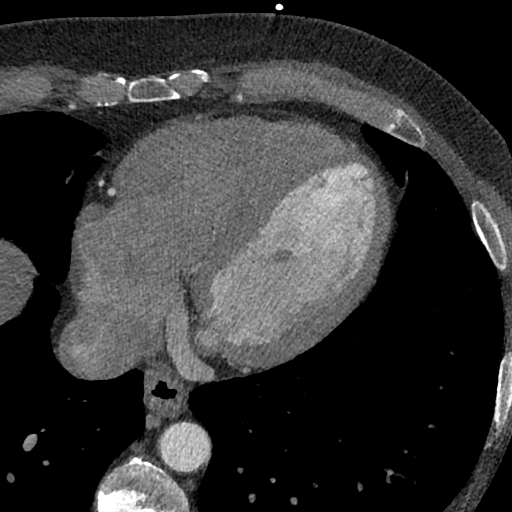
[im 121/282  vessel]
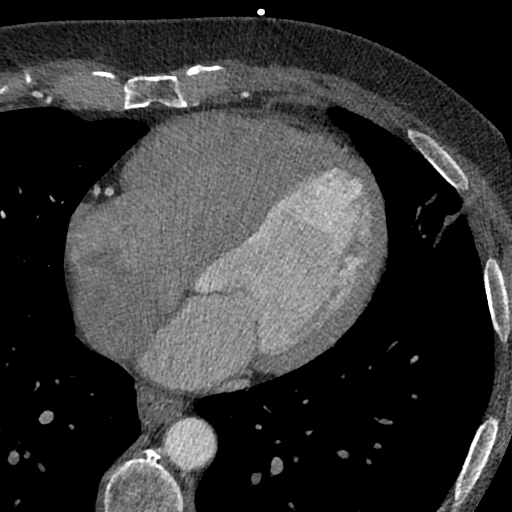

[9 of 20 positions shown; findings below may reference images not displayed]

FINDINGS: Vascular: Coronary calcifications. No significant stroke cardiac
vascular findings.

Mediastinum/Nodes: No lymphadenopathy.

Lungs/Pleura: Lingular scarring/subsegmental atelectasis. No focal
airspace consolidation. No suspicious pulmonary nodules.

Upper Abdomen: No acute abnormality.

Musculoskeletal: No acute osseous abnormality. No suspicious lytic
or blastic lesions.
IMPRESSION: No acute or significant incidental extracardiac findings in the
chest. Interpretation of cardiac/coronary anatomy by cardiology to
follow.
FINDINGS: Scan was triggered in the descending thoracic aorta. Axial
non-contrast 3 mm slices were carried out through the heart. The
data set was analyzed on a dedicated work station. 0.8 mg of sl NTG
was given. Flash Sequence. Diastolic phases were analyzed on a
dedicated work station using MPR, MIP and VRT modes. The patient
received 100 cc of contrast.

Coronary Arteries:  Normal coronary origin.  Right dominance.

Coronary Calcium Score:

Left main: 28

Left anterior descending artery: 6

Left circumflex artery: 14

Right coronary artery: 73

Total: 121

Percentile: 87th for age, sex, and race matched control.

RCA is a large dominant artery that gives rise to PDA and PLA. Mild
non-obstructive mixed plaque (25-49%) in proximal RCA. Mild
non-obstructive mixed plaque (25-49%) in mid RCA.

Left main is a large artery that gives rise to LAD and LCX arteries.
Mild non-obstructive mixed plaque (25-49%) in distal vessel.

LAD is a large vessel that gives rise to one large D1 Branch.
Minimal non-obstructive calcified plaque (1-24%) in mid LAD.

LCX is a non-dominant artery. Mild non-obstructive calcified plaque
(25-49%) in ostial vessel.

Other findings:

Aorta: Normal size.  No calcifications.  No dissection.

Main Pulmonary Artery: Dilation of the main pulmonary artery: Mild
to moderate at 31 mm but with trunk to aorta ratio of 0.96.

Aortic Valve:  Tri-leaflet.  No calcifications.

Normal pulmonary vein drainage into the left atrium.

Normal left atrial appendage without a thrombus.

Interatrial septum without notable communication.

Extra-cardiac findings: See attached radiology report for
non-cardiac structures.
IMPRESSION: 1. Coronary calcium score of 121. This was 87th percentile for age,
sex, and race matched control.

2. Normal coronary origin with right dominance.

3. Dilation of the main pulmonary artery: Mild to moderate at 31 mm
but with trunk to aorta ratio of 0.96. This can be associated with
the presence of pulmonary hypertension; clinical correlation
advised.

4. CAD-RADS 2. Mild non-obstructive CAD (25-49%). Consider
non-atherosclerotic causes of chest pain. Consider preventive
therapy and risk factor modification. FFR sent for Left main
evaluation.

RECOMMENDATIONS:



If CAC = 0, it is reasonable to withhold statin therapy and reassess
in 5 to 10 years, as long as higher risk conditions are absent
(diabetes mellitus, family history of premature CHD in first degree
relatives (males <55 years; females <65 years), cigarette smoking,
LDL >=190 mg/dL or other independent risk factors).

If CAC is 1 to 99, it is reasonable to initiate statin therapy for
patients >=55 years of age.

If CAC is >=100 or >=75th percentile, it is reasonable to initiate
statin therapy at any age.

Cardiology referral should be considered for patients with CAC
scores =400 or >=75th percentile.

*3335 AHA/ACC/AACVPR/AAPA/ABC/KENTURA/PIVARAL/STORM/Capataru/SARAII/ROLAN/MADMOVE
Guideline on the Management of Blood Cholesterol: A Report of the
American College of Cardiology/American Heart Association Task Force
on Clinical Practice Guidelines. J Am Coll Cardiol.
9427;73(24):3596-3794.

ADDENDUM:
Calcium scoring percentile scoring clarified:

For 51 year old white Male.

Score is 121.

87th percentile

*** End of Addendum ***
Addendum:
EXAM:
OVER-READ INTERPRETATION  CT CHEST

The following report is a limited chest CT over-read performed by
This over-read does not include interpretation of cardiac or
coronary anatomy or pathology. The coronary CTA interpretation by
the cardiologist is attached.
FINDINGS: Vascular: Coronary calcifications. No significant stroke cardiac
vascular findings.

Mediastinum/Nodes: No lymphadenopathy.

Lungs/Pleura: Lingular scarring/subsegmental atelectasis. No focal
airspace consolidation. No suspicious pulmonary nodules.

Upper Abdomen: No acute abnormality.

Musculoskeletal: No acute osseous abnormality. No suspicious lytic
or blastic lesions.
IMPRESSION: No acute or significant incidental extracardiac findings in the
chest. Interpretation of cardiac/coronary anatomy by cardiology to
follow.
FINDINGS: Scan was triggered in the descending thoracic aorta. Axial
non-contrast 3 mm slices were carried out through the heart. The
data set was analyzed on a dedicated work station. 0.8 mg of sl NTG
was given. Flash Sequence. Diastolic phases were analyzed on a
dedicated work station using MPR, MIP and VRT modes. The patient
received 100 cc of contrast.

Coronary Arteries:  Normal coronary origin.  Right dominance.

Coronary Calcium Score:

Left main: 28

Left anterior descending artery: 6

Left circumflex artery: 14

Right coronary artery: 73

Total: 121

Percentile: 87th for age, sex, and race matched control.

RCA is a large dominant artery that gives rise to PDA and PLA. Mild
non-obstructive mixed plaque (25-49%) in proximal RCA. Mild
non-obstructive mixed plaque (25-49%) in mid RCA.

Left main is a large artery that gives rise to LAD and LCX arteries.
Mild non-obstructive mixed plaque (25-49%) in distal vessel.

LAD is a large vessel that gives rise to one large D1 Branch.
Minimal non-obstructive calcified plaque (1-24%) in mid LAD.

LCX is a non-dominant artery. Mild non-obstructive calcified plaque
(25-49%) in ostial vessel.

Other findings:

Aorta: Normal size.  No calcifications.  No dissection.

Main Pulmonary Artery: Dilation of the main pulmonary artery: Mild
to moderate at 31 mm but with trunk to aorta ratio of 0.96.

Aortic Valve:  Tri-leaflet.  No calcifications.

Normal pulmonary vein drainage into the left atrium.

Normal left atrial appendage without a thrombus.

Interatrial septum without notable communication.

Extra-cardiac findings: See attached radiology report for
non-cardiac structures.
IMPRESSION: 1. Coronary calcium score of 121. This was 87th percentile for age,
sex, and race matched control.

2. Normal coronary origin with right dominance.

3. Dilation of the main pulmonary artery: Mild to moderate at 31 mm
but with trunk to aorta ratio of 0.96. This can be associated with
the presence of pulmonary hypertension; clinical correlation
advised.

4. CAD-RADS 2. Mild non-obstructive CAD (25-49%). Consider
non-atherosclerotic causes of chest pain. Consider preventive
therapy and risk factor modification. FFR sent for Left main
evaluation.

RECOMMENDATIONS:



If CAC = 0, it is reasonable to withhold statin therapy and reassess
in 5 to 10 years, as long as higher risk conditions are absent
(diabetes mellitus, family history of premature CHD in first degree
relatives (males <55 years; females <65 years), cigarette smoking,
LDL >=190 mg/dL or other independent risk factors).

If CAC is 1 to 99, it is reasonable to initiate statin therapy for
patients >=55 years of age.

If CAC is >=100 or >=75th percentile, it is reasonable to initiate
statin therapy at any age.

Cardiology referral should be considered for patients with CAC
scores =400 or >=75th percentile.

*3335 AHA/ACC/AACVPR/AAPA/ABC/KENTURA/PIVARAL/STORM/Capataru/SARAII/ROLAN/MADMOVE
Guideline on the Management of Blood Cholesterol: A Report of the
American College of Cardiology/American Heart Association Task Force
on Clinical Practice Guidelines. J Am Coll Cardiol.
9427;73(24):3596-3794.

*** End of Addendum ***
EXAM:
OVER-READ INTERPRETATION  CT CHEST

The following report is a limited chest CT over-read performed by
This over-read does not include interpretation of cardiac or
coronary anatomy or pathology. The coronary CTA interpretation by
the cardiologist is attached.
FINDINGS: Vascular: Coronary calcifications. No significant stroke cardiac
vascular findings.

Mediastinum/Nodes: No lymphadenopathy.

Lungs/Pleura: Lingular scarring/subsegmental atelectasis. No focal
airspace consolidation. No suspicious pulmonary nodules.

Upper Abdomen: No acute abnormality.

Musculoskeletal: No acute osseous abnormality. No suspicious lytic
or blastic lesions.
IMPRESSION: No acute or significant incidental extracardiac findings in the
chest. Interpretation of cardiac/coronary anatomy by cardiology to
follow.

## 2023-02-19 NOTE — Telephone Encounter (Signed)
Patient states he got the message but forgot to call the sleep lab to make his appointment.

## 2023-03-08 ENCOUNTER — Encounter (HOSPITAL_BASED_OUTPATIENT_CLINIC_OR_DEPARTMENT_OTHER): Payer: Self-pay

## 2023-03-08 ENCOUNTER — Ambulatory Visit (HOSPITAL_BASED_OUTPATIENT_CLINIC_OR_DEPARTMENT_OTHER): Payer: BC Managed Care – PPO | Admitting: Internal Medicine

## 2023-03-08 DIAGNOSIS — G4733 Obstructive sleep apnea (adult) (pediatric): Secondary | ICD-10-CM | POA: Diagnosis present

## 2023-03-22 ENCOUNTER — Ambulatory Visit (HOSPITAL_BASED_OUTPATIENT_CLINIC_OR_DEPARTMENT_OTHER): Payer: BC Managed Care – PPO | Attending: Adult Health | Admitting: Cardiology

## 2023-03-24 ENCOUNTER — Encounter (INDEPENDENT_AMBULATORY_CARE_PROVIDER_SITE_OTHER): Payer: BC Managed Care – PPO | Admitting: Cardiology

## 2023-03-24 DIAGNOSIS — G4733 Obstructive sleep apnea (adult) (pediatric): Secondary | ICD-10-CM

## 2023-03-27 NOTE — Procedures (Signed)
   Patient Name: Ryan Skinner, Ryan Skinner Date: 03/24/2023 Gender: Male D.O.B: [birth year] Age (years): 44 Referring Provider: Joni Reining NP Height (inches): 75 Interpreting Physician: Armanda Magic MD, ABSM Weight (lbs): 236 RPSGT: Barnard Sink BMI: 29 MRN: [id number] Neck Size: 17.00  CLINICAL INFORMATION Sleep Study Type: HST  Indication for sleep study: N/A  Epworth Sleepiness Score: N/A  SLEEP STUDY TECHNIQUE A multi-channel overnight portable sleep study was performed. The channels recorded were: nasal airflow, thoracic respiratory movement, and oxygen saturation with a pulse oximetry. Snoring was also monitored.  MEDICATIONS Patient self administered medications include: N/A.  SLEEP ARCHITECTURE Patient was studied for 335 minutes. The sleep efficiency was 100.0 % and the patient was supine for 0%. The arousal index was 0.0 per hour.  RESPIRATORY PARAMETERS The overall AHI was 7.2 per hour, with a central apnea index of 0 per hour.  The oxygen nadir was 87% during sleep.  CARDIAC DATA Mean heart rate during sleep was 42.5 bpm.  IMPRESSIONS - Mild obstructive sleep apnea occurred during this study (AHI = 7.2/h). - Mild oxygen desaturation was noted during this study (Min O2 = 87%). - Patient snored 0.1% during the sleep.  DIAGNOSIS - Obstructive Sleep Apnea (G47.33)  RECOMMENDATIONS - Therapeutic CPAP titration to determine optimal pressure required to alleviate sleep disordered breathing. - Oral appliance may be considered. - Avoid alcohol, sedatives and other CNS depressants that may worsen sleep apnea and disrupt normal sleep architecture. - Sleep hygiene should be reviewed to assess factors that may improve sleep quality. - Weight management and regular exercise should be initiated or continued.  [Electronically signed] 03/27/2023 10:35 PM  Armanda Magic MD, ABSM Diplomate, American Board of Sleep Medicine

## 2023-04-01 ENCOUNTER — Telehealth: Payer: Self-pay

## 2023-04-01 NOTE — Telephone Encounter (Signed)
Notified patient of sleep study results and recommendations. All questions were answered and patient verbalized understanding. CPAP order sent to [facility].

## 2023-04-01 NOTE — Telephone Encounter (Signed)
-----   Message from Armanda Magic sent at 03/27/2023 10:37 PM EST ----- Please let patient know that they have sleep apnea and recommend treating with CPAP.  Please order an auto CPAP from 4-15cm H2O with heated humidity and mask of choice.  Order overnight pulse ox on CPAP.  Followup with me in 6 weeks.

## 2023-07-24 ENCOUNTER — Ambulatory Visit: Payer: BC Managed Care – PPO | Attending: Cardiovascular Disease | Admitting: Cardiovascular Disease

## 2023-07-24 ENCOUNTER — Encounter: Payer: Self-pay | Admitting: Cardiovascular Disease

## 2023-07-24 VITALS — BP 120/92 | HR 49 | Ht 75.0 in | Wt 241.6 lb

## 2023-07-24 DIAGNOSIS — G4733 Obstructive sleep apnea (adult) (pediatric): Secondary | ICD-10-CM

## 2023-07-24 DIAGNOSIS — I10 Essential (primary) hypertension: Secondary | ICD-10-CM

## 2023-07-24 DIAGNOSIS — R002 Palpitations: Secondary | ICD-10-CM

## 2023-07-24 DIAGNOSIS — R931 Abnormal findings on diagnostic imaging of heart and coronary circulation: Secondary | ICD-10-CM

## 2023-07-24 DIAGNOSIS — E782 Mixed hyperlipidemia: Secondary | ICD-10-CM | POA: Diagnosis not present

## 2023-07-24 DIAGNOSIS — Z8249 Family history of ischemic heart disease and other diseases of the circulatory system: Secondary | ICD-10-CM

## 2023-07-24 NOTE — Assessment & Plan Note (Signed)
 History of hyperlipidemia on rosuvastatin low-dose which he takes sporadically because of side effects lipid profile performed 07/20/2022 revealing total cholesterol 153, LDL 96 and HDL 41.  I am going to recheck a lipid liver profile.  If it remains elevated I will refer him to a Pharm.D. for consideration of nonstatin medications with an LDL goal of less than 70.

## 2023-07-24 NOTE — Patient Instructions (Signed)
 Medication Instructions:  Your physician recommends that you continue on your current medications as directed. Please refer to the Current Medication list given to you today.  *If you need a refill on your cardiac medications before your next appointment, please call your pharmacy*   Lab Work: Your physician recommends that you return for lab work in: the next week or 2 for FASTING lipid/liver panel  If you have labs (blood work) drawn today and your tests are completely normal, you will receive your results only by: MyChart Message (if you have MyChart) OR A paper copy in the mail If you have any lab test that is abnormal or we need to change your treatment, we will call you to review the results.   Follow-Up: At [facility], you and your health needs are our priority.  As part of our continuing mission to provide you with exceptional heart care, we have created designated Provider Care Teams.  These Care Teams include your primary Cardiologist (physician) and Advanced Practice Providers (APPs -  Physician Assistants and Nurse Practitioners) who all work together to provide you with the care you need, when you need it.  We recommend signing up for the patient portal called "MyChart".  Sign up information is provided on this After Visit Summary.  MyChart is used to connect with patients for Virtual Visits (Telemedicine).  Patients are able to view lab/test results, encounter notes, upcoming appointments, etc.  Non-urgent messages can be sent to your provider as well.   To learn more about what you can do with MyChart, go to [email or web address].    Your next appointment:   12 month(s)  Provider:   Nanetta Batty, MD    Other Instructions   1st Floor: - Lobby - Registration  - Pharmacy  - Lab - Cafe  2nd Floor: - PV Lab - Diagnostic Testing (echo, CT, nuclear med)  3rd Floor: - Vacant  4th Floor: - TCTS (cardiothoracic surgery) - AFib Clinic - Structural  Heart Clinic - Vascular Surgery  - Vascular Ultrasound  5th Floor: - [facility] Cardiology (general and EP) - Clinical Pharmacy for coumadin, hypertension, lipid, weight-loss medications, and med management appointments    Valet parking services will be available as well.

## 2023-07-24 NOTE — Assessment & Plan Note (Signed)
 Elevated coronary calcium score of 122 distributed throughout his coronary tree performed 06/10/2020.  He is asymptomatic.  He takes his statin sporadically because of statin intolerance.  I am going to recheck a lipid liver profile.

## 2023-07-24 NOTE — Assessment & Plan Note (Signed)
History of obstructive sleep apnea on CPAP followed by Dr. Radford Pax.

## 2023-07-24 NOTE — Progress Notes (Signed)
 07/24/2023 Janann Colonel [facility]   Sep 01, 1970  [id number]  Primary Physician Alysia Penna, MD Primary Cardiologist: Runell Gess MD Nicholes Calamity, [place]  HPI:  Ryan Skinner is a 53 y.o.  mildly overweight married Caucasian male father of 2 children who is referred by Dr. Link Snuffer  his primary care physician, for cardiovascular evaluation because of a mildly elevated coronary calcium score.  I last saw him in the office 09/01/2021.  He works as a Armed forces training and education officer. His cardiac risk factors profile is notable for treated hypertension and hyperlipidemia. He does not smoke. Both his parents had CAD. He is never had a heart attack or stroke. He denies chest pain or shortness of breath. He did have a coronary calcium score performed 06/10/2020 which was 122 distributed throughout his coronary tree. He does complain of some atypical symptoms after drinking water with discomfort in his axilla bilaterally rating to his back.  Since I saw him 2 years ago he is remained stable.  He is gained 10 pounds.  Is been diagnosed with obstructive sleep apnea by Dr. Mayford Knife and placed on CPAP.  I placed him on low-dose statin therapy because of his elevated coronary calcium score which apparently he is intolerant to.  His PCP discontinued his losartan hydrochlorothiazide.  He was having palpitations and had an event monitor performed 09/07/2022 revealing short runs of SVT, NSVT and frequent PVCs with a 6.1% burden currently on a low-dose beta-blocker.     Current Meds  Medication Sig   hydrocortisone 2.5 % cream Apply topically 2 (two) times daily as needed (Rash).   rosuvastatin (CRESTOR) 5 MG tablet Take 5 mg by mouth at bedtime.   tamsulosin (FLOMAX) 0.4 MG CAPS capsule Take 0.4 mg by mouth daily.   Vibegron 75 MG TABS Take 1 tablet by mouth daily.   [DISCONTINUED] losartan-hydrochlorothiazide (HYZAAR) 100-25 MG tablet Take 1 tablet by mouth daily.     No Known Allergies  Social History    Socioeconomic History   Marital status: Married    Spouse name: Not on file   Number of children: Not on file   Years of education: Not on file   Highest education level: Not on file  Occupational History   Not on file  Tobacco Use   Smoking status: Never   Smokeless tobacco: Never  Substance and Sexual Activity   Alcohol use: Yes    Comment: occasional   Drug use: No   Sexual activity: Not on file  Other Topics Concern   Not on file  Social History Narrative   Not on file   Social Drivers of Health   Financial Resource Strain: Not on file  Food Insecurity: Not on file  Transportation Needs: Not on file  Physical Activity: Not on file  Stress: Not on file  Social Connections: Unknown (09/12/2021)   Received from [facility], [facility]   Social Network    Social Network: Not on file  Intimate Partner Violence: Unknown (08/03/2021)   Received from [facility], [facility]   HITS    Physically Hurt: Not on file    Insult or Talk Down To: Not on file    Threaten Physical Harm: Not on file    Scream or Curse: Not on file     Review of Systems: General: negative for chills, fever, night sweats or weight changes.  Cardiovascular: negative for chest pain, dyspnea on exertion, edema, orthopnea, palpitations, paroxysmal nocturnal dyspnea or shortness of breath  Dermatological: negative for rash Respiratory: negative for cough or wheezing Urologic: negative for hematuria Abdominal: negative for nausea, vomiting, diarrhea, bright red blood per rectum, melena, or hematemesis Neurologic: negative for visual changes, syncope, or dizziness All other systems reviewed and are otherwise negative except as noted above.    Blood pressure (!) 120/92, pulse (!) 49, height 6\' 3"  (1.905 m), weight 241 lb 9.6 oz (109.6 kg), SpO2 96%.  General appearance: alert and no distress Neck: no adenopathy, no carotid bruit, no JVD, supple, symmetrical, trachea midline, and thyroid not  enlarged, symmetric, no tenderness/mass/nodules Lungs: clear to auscultation bilaterally Heart: regular rate and rhythm, S1, S2 normal, no murmur, click, rub or gallop Extremities: extremities normal, atraumatic, no cyanosis or edema Pulses: 2+ and symmetric Skin: Skin color, texture, turgor normal. No rashes or lesions Neurologic: Grossly normal  EKG EKG Interpretation Date/Time:  Wednesday July 24 2023 15:58:54 EDT Ventricular Rate:  49 PR Interval:  158 QRS Duration:  80 QT Interval:  434 QTC Calculation: 392 R Axis:   82  Text Interpretation: Sinus bradycardia with occasional Premature ventricular complexes Possible Inferior infarct , age undetermined When compared with ECG of 01-Feb-2023 10:15, Premature ventricular complexes are now Present Borderline criteria for Inferior infarct are now Present T wave inversion now evident in Inferior leads Confirmed by Nanetta Batty [phone]) on 07/24/2023 4:02:18 PM    ASSESSMENT AND PLAN:   Elevated coronary artery calcium score Elevated coronary calcium score of 122 distributed throughout his coronary tree performed 06/10/2020.  He is asymptomatic.  He takes his statin sporadically because of statin intolerance.  I am going to recheck a lipid liver profile.  Hyperlipidemia History of hyperlipidemia on rosuvastatin low-dose which he takes sporadically because of side effects lipid profile performed 07/20/2022 revealing total cholesterol 153, LDL 96 and HDL 41.  I am going to recheck a lipid liver profile.  If it remains elevated I will refer him to a Pharm.D. for consideration of nonstatin medications with an LDL goal of less than 70.  Palpitations History of palpitations with event monitor that has shown short runs of SVT, NSVT and frequent PVCs with a 6.1% burden performed 10/14/2022.  He is on a low-dose beta-blocker.  These are less noticeable.  Obstructive sleep apnea History of obstructive sleep apnea on CPAP followed by Dr.  Mayford Knife.  Essential hypertension History of essential hypertension blood pressure measured today at 120/92.  He is on metoprolol.  He apparently was on losartan hydrochlorothiazide in the past which was discontinued.     Runell Gess MD FACP,FACC,FAHA, [facility] 07/24/2023 4:17 PM

## 2023-07-24 NOTE — Assessment & Plan Note (Signed)
 History of essential hypertension blood pressure measured today at 120/92.  He is on metoprolol.  He apparently was on losartan hydrochlorothiazide in the past which was discontinued.

## 2023-07-24 NOTE — Assessment & Plan Note (Signed)
 History of palpitations with event monitor that has shown short runs of SVT, NSVT and frequent PVCs with a 6.1% burden performed 10/14/2022.  He is on a low-dose beta-blocker.  These are less noticeable.

## 2023-08-01 NOTE — Progress Notes (Unsigned)
 SLEEP MEDICINE VIRTUAL CONSULT NOTEvia Video Note   Because of Eligah Anello Skinner's co-morbid illnesses, he is at least at moderate risk for complications without adequate follow up.  This format is felt to be most appropriate for this patient at this time.  All issues noted in this document were discussed and addressed.  A limited physical exam was performed with this format.  Please refer to the patient's chart for his consent to telehealth for Ryan Skinner.      Date:  08/01/2023   ID:  Ryan Skinner, DOB [birth year], MRN [id number] The patient was identified using 2 identifiers.  Patient Location: Home Provider Location: Home Office   PCP:  Alysia Penna, MD   [facility] Providers Cardiologist:  Nanetta Batty, MD     Evaluation Performed:  New Patient Evaluation  Chief Complaint:  OSA  History of Present Illness:    Ryan Skinner is a 53 y.o. male who is being seen today for the evaluation of OSA at the request of Nanetta Batty, MD.  [name] is a 53 y.o. male with a hx of HTN, mild CAD, NSVT, PSVT and HLD who has had problems with frequent ventricular arrhythmias and a home sleep study was ordered to rule out significant OSA as a possible etiology.  He underwent HST 03/24/2023 showing mild OSA with an AHI of 7.2/hr and was started on auto CPAP from 4 to 15cm H2O.  He is now referred for sleep medicine consultation to establish treatment of OSA.  He is doing well with his PAP device and thinks that he has gotten used to it.  He tolerates the mask and feels the pressure is adequate.  Since going on PAP he feels rested in the am and has no significant daytime sleepiness.  He denies any significant mouth or nasal dryness or nasal congestion.  He does not think that he snores.     Past Medical History:  Diagnosis Date   Hypertension    Past Surgical History:  Procedure Laterality Date   APPENDECTOMY     CYSTECTOMY     right wrist    HERNIA REPAIR       No outpatient medications have been marked as taking for the 08/02/23 encounter (Appointment) with Quintella Reichert, MD.     Allergies:   Patient has no known allergies.   Social History   Tobacco Use   Smoking status: Never   Smokeless tobacco: Never  Substance Use Topics   Alcohol use: Yes    Comment: occasional   Drug use: No     Family Hx: The patient's family history includes Diabetes in his father; Heart disease in his mother; Hypertension in his father; Prostate cancer in his paternal uncle. There is no history of Colon cancer, Esophageal cancer, Rectal cancer, or Stomach cancer.  ROS:   Please see the history of present illness.     All other systems reviewed and are negative.   Prior Sleep studies:   The following studies were reviewed today:  HST, PAP compliance download  Labs/Other Tests and Data Reviewed:     Recent Labs: No results found for requested labs within last 365 days.    Wt Readings from Last 3 Encounters:  07/24/23 241 lb 9.6 oz (109.6 kg)  02/01/23 236 lb 9.6 oz (107.3 kg)  10/19/22 231 lb 9.6 oz (105.1 kg)     Risk Assessment/Calculations:  Objective:    Vital Signs:  There were no vitals taken for this visit.   VITAL SIGNS:  reviewed GEN:  no acute distress EYES:  sclerae anicteric, EOMI - Extraocular Movements Intact RESPIRATORY:  normal respiratory effort, symmetric expansion CARDIOVASCULAR:  no peripheral edema SKIN:  no rash, lesions or ulcers. MUSCULOSKELETAL:  no obvious deformities. NEURO:  alert and oriented x 3, no obvious focal deficit PSYCH:  normal affect  ASSESSMENT & PLAN:    OSA - The patient is tolerating PAP therapy well without any problems. The PAP download performed by his DME was personally reviewed and interpreted by me today and showed an AHI of 0.4/hr on auto CPAP from 4 to 15 cm H2O with 37% compliance in using more than 4 hours nightly.  The patient has been using and  benefiting from PAP use and will continue to benefit from therapy.    HTN -BP controlled on exam today -continue prescription drug management with Lopressor 25mg  BID with PRN refills  Time:   Today, I have spent 15 minutes with the patient with telehealth technology discussing the above problems.     Medication Adjustments/Labs and Tests Ordered: Current medicines are reviewed at length with the patient today.  Concerns regarding medicines are outlined above.   Tests Ordered: No orders of the defined types were placed in this encounter.   Medication Changes: No orders of the defined types were placed in this encounter.   Follow Up:  In Person in 1 year(s)  Signed, Armanda Magic, MD  08/01/2023 8:54 PM    [facility]

## 2023-08-02 ENCOUNTER — Encounter: Payer: Self-pay | Admitting: Cardiology

## 2023-08-02 ENCOUNTER — Ambulatory Visit: Payer: BC Managed Care – PPO | Attending: Cardiology | Admitting: Cardiology

## 2023-08-02 DIAGNOSIS — G4733 Obstructive sleep apnea (adult) (pediatric): Secondary | ICD-10-CM

## 2023-08-02 DIAGNOSIS — I10 Essential (primary) hypertension: Secondary | ICD-10-CM | POA: Diagnosis not present

## 2023-08-02 LAB — LAB REPORT - SCANNED: EGFR: 100.7

## 2023-08-02 NOTE — Patient Instructions (Signed)
 Medication Instructions:   *If you need a refill on your cardiac medications before your next appointment, please call your pharmacy*  Lab Work:  If you have labs (blood work) drawn today and your tests are completely normal, you will receive your results only by: MyChart Message (if you have MyChart) OR A paper copy in the mail If you have any lab test that is abnormal or we need to change your treatment, we will call you to review the results.  Testing/Procedures:   Follow-Up: At [facility], you and your health needs are our priority.  As part of our continuing mission to provide you with exceptional heart care, our providers are all part of one team.  This team includes your primary Cardiologist (physician) and Advanced Practice Providers or APPs (Physician Assistants and Nurse Practitioners) who all work together to provide you with the care you need, when you need it.  Your next appointment:  6-8- week virtual with Dr Armanda Magic    We recommend signing up for the patient portal called "MyChart".  Sign up information is provided on this After Visit Summary.  MyChart is used to connect with patients for Virtual Visits (Telemedicine).  Patients are able to view lab/test results, encounter notes, upcoming appointments, etc.  Non-urgent messages can be sent to your provider as well.   To learn more about what you can do with MyChart, go to [email or web address].   Other Instructions       1st Floor: - Lobby - Registration  - Pharmacy  - Lab - Cafe  2nd Floor: - PV Lab - Diagnostic Testing (echo, CT, nuclear med)  3rd Floor: - Vacant  4th Floor: - TCTS (cardiothoracic surgery) - AFib Clinic - Structural Heart Clinic - Vascular Surgery  - Vascular Ultrasound  5th Floor: - HeartCare Cardiology (general and EP) - Clinical Pharmacy for coumadin, hypertension, lipid, weight-loss medications, and med management appointments    Valet parking services will  be available as well.

## 2023-08-12 ENCOUNTER — Encounter: Payer: Self-pay | Admitting: Internal Medicine

## 2023-08-16 ENCOUNTER — Encounter: Payer: Self-pay | Admitting: Cardiology

## 2023-08-16 NOTE — Progress Notes (Signed)
 Lab results from Dr. Del Favia from 08/02/2023 Pretty normal kidney liver function and blood cell count. Na+ 142, K+ 4.6, Cl- 107, HCO3-28, BUN 19, Cr 0.8 (normal kidney function), Glu 101 (borderline high), Ca2+ 9.1; AST 19, ALT 18, AlkP 37 (normal liver function test) CBC: W 4.9, H/H 15.1/45.9, Plt 170 (normal blood counts) TSH normal at 2.24 Cholesterol levels: Total cholesterol 152, triglycerides 62, HDL 35 (borderline low), LDL 105  With an LDL of 105, and intolerance to statins, Dr. Katheryne Pane is recommended follow-up with our Clinical Pharmacist Team to discuss other options.  I would recommend continuing this follow-up for medication adjustment based on elevated Coronary Calcium Score.  Randene Bustard, MD

## 2023-10-03 NOTE — Progress Notes (Unsigned)
 SLEEP MEDICINE VIRTUAL CONSULT NOTE via Video Note   Because of Ryan Skinner's co-morbid illnesses, he is at least at moderate risk for complications without adequate follow up.  This format is felt to be most appropriate for this patient at this time.  All issues noted in this document were discussed and addressed.  A limited physical exam was performed with this format.  Please refer to the patient's chart for his consent to telehealth for [facility].      Date:  10/03/2023   ID:  Ryan Skinner, DOB 06-23-1970, MRN [id number] The patient was identified using 2 identifiers.  Patient Location: Home Provider Location: Home Office   PCP:  Barnetta Liberty, MD   [facility] Providers Cardiologist:  Lauro Portal, MD     Evaluation Performed:  New Patient Evaluation  Chief Complaint:  OSA  History of Present Illness:    Ryan Skinner is a 53 y.o. male with a hx of HTN, mild CAD, NSVT, PSVT and HLD who has had problems with frequent ventricular arrhythmias and a home sleep study was ordered  by Friddie Jetty, NP to rule out significant OSA as a possible etiology.  He underwent HST 03/24/2023 showing mild OSA with an AHI of 7.2/hr and was started on auto CPAP from 4 to 15cm H2O.    He is doing well with his PAP device and thinks that he has gotten used to it.  He tolerates the mask and feels the pressure is adequate.  Since going on PAP he feels rested in the am and has no significant daytime sleepiness.  He denies any significant mouth or nasal dryness or nasal congestion.  He does not think that he snores.     Past Medical History:  Diagnosis Date   Hypertension    OSA on CPAP    mild OSA with an AHI of 7.2/hr and was started on auto CPAP from 4 to 15cm H2O.   Past Surgical History:  Procedure Laterality Date   APPENDECTOMY     CYSTECTOMY     right wrist   HERNIA REPAIR       No outpatient medications have been marked as taking for the  10/04/23 encounter (Appointment) with Jacqueline Matsu, MD.     Allergies:   Patient has no known allergies.   Social History   Tobacco Use   Smoking status: Never   Smokeless tobacco: Never  Substance Use Topics   Alcohol  use: Yes    Comment: occasional   Drug use: No     Family Hx: The patient's family history includes Diabetes in his father; Heart disease in his mother; Hypertension in his father; Prostate cancer in his paternal uncle. There is no history of Colon cancer, Esophageal cancer, Rectal cancer, or Stomach cancer.  ROS:   Please see the history of present illness.     All other systems reviewed and are negative.   Prior Sleep studies:   The following studies were reviewed today:  HST, PAP compliance download  Labs/Other Tests and Data Reviewed:     Recent Labs: No results found for requested labs within last 365 days.    Wt Readings from Last 3 Encounters:  07/24/23 241 lb 9.6 oz (109.6 kg)  02/01/23 236 lb 9.6 oz (107.3 kg)  10/19/22 231 lb 9.6 oz (105.1 kg)     Risk Assessment/Calculations:          Objective:  Vital Signs:  There were no vitals taken for this visit.   VITAL SIGNS:  reviewed GEN:  no acute distress EYES:  sclerae anicteric, EOMI - Extraocular Movements Intact RESPIRATORY:  normal respiratory effort, symmetric expansion CARDIOVASCULAR:  no peripheral edema SKIN:  no rash, lesions or ulcers. MUSCULOSKELETAL:  no obvious deformities. NEURO:  alert and oriented x 3, no obvious focal deficit PSYCH:  normal affect  ASSESSMENT & PLAN:    OSA - The patient is tolerating PAP therapy well without any problems. The PAP download performed by his DME was personally reviewed and interpreted by me today and showed an AHI of ***/hr on *** cm H2O with ***% compliance in using more than 4 hours nightly.  The patient has been using and benefiting from PAP use and will continue to benefit from therapy.   HTN PVCs -BP controlled on exam  today -denies any palpitations -continue Lopressor  12.5mg  BID with PRN refills  Time:   Today, I have spent 15 minutes with the patient with telehealth technology discussing the above problems.     Medication Adjustments/Labs and Tests Ordered: Current medicines are reviewed at length with the patient today.  Concerns regarding medicines are outlined above.   Tests Ordered: No orders of the defined types were placed in this encounter.   Medication Changes: No orders of the defined types were placed in this encounter.   Follow Up:  6 -8 weeks virtual  Signed, Gaylyn Keas, MD  10/03/2023 10:43 PM    [facility]

## 2023-10-04 ENCOUNTER — Encounter: Payer: Self-pay | Admitting: Cardiology

## 2023-10-04 ENCOUNTER — Ambulatory Visit: Attending: Cardiology | Admitting: Cardiology

## 2023-10-04 VITALS — Ht 75.0 in | Wt 245.0 lb

## 2023-10-04 DIAGNOSIS — G4733 Obstructive sleep apnea (adult) (pediatric): Secondary | ICD-10-CM

## 2023-10-04 DIAGNOSIS — R002 Palpitations: Secondary | ICD-10-CM | POA: Diagnosis not present

## 2023-10-04 DIAGNOSIS — I10 Essential (primary) hypertension: Secondary | ICD-10-CM

## 2023-10-04 NOTE — Patient Instructions (Signed)
 Medication Instructions:  Your physician recommends that you continue on your current medications as directed. Please refer to the Current Medication list given to you today.  *If you need a refill on your cardiac medications before your next appointment, please call your pharmacy*  Follow-Up: At [facility], you and your health needs are our priority.  As part of our continuing mission to provide you with exceptional heart care, our providers are all part of one team.  This team includes your primary Cardiologist (physician) and Advanced Practice Providers or APPs (Physician Assistants and Nurse Practitioners) who all work together to provide you with the care you need, when you need it.  Your next appointment:   3 months  Provider:   Gaylyn Keas, MD

## 2023-10-12 ENCOUNTER — Other Ambulatory Visit: Payer: Self-pay | Admitting: Adult Health

## 2023-10-16 ENCOUNTER — Other Ambulatory Visit: Payer: Self-pay

## 2023-10-16 MED ORDER — METOPROLOL TARTRATE 25 MG PO TABS: 25.0000 mg | ORAL_TABLET | Freq: Two times a day (BID) | ORAL | 3 refills | Status: AC

## 2023-12-03 NOTE — Procedures (Signed)
 Cancelled.

## 2023-12-11 ENCOUNTER — Other Ambulatory Visit: Payer: Self-pay | Admitting: Urology

## 2023-12-11 DIAGNOSIS — R972 Elevated prostate specific antigen [PSA]: Secondary | ICD-10-CM

## 2023-12-17 ENCOUNTER — Encounter: Payer: Self-pay | Admitting: Urology

## 2023-12-19 ENCOUNTER — Encounter: Payer: Self-pay | Admitting: Urology

## 2024-01-12 ENCOUNTER — Ambulatory Visit
Admission: RE | Admit: 2024-01-12 | Discharge: 2024-01-12 | Disposition: A | Discharge status: Home / Self Care | Source: Ambulatory Visit | Attending: Urology | Admitting: Urology

## 2024-01-12 DIAGNOSIS — R972 Elevated prostate specific antigen [PSA]: Secondary | ICD-10-CM

## 2024-01-12 MED ORDER — GADOPICLENOL 0.5 MMOL/ML IV SOLN
10.0000 mL | Freq: Once | INTRAVENOUS | Status: AC | PRN
Start: 2024-01-12 — End: 2024-01-12
  Administered 2024-01-12: 10 mL via INTRAVENOUS

## 2024-01-13 ENCOUNTER — Ambulatory Visit: Admitting: Cardiology

## 2024-05-20 ENCOUNTER — Other Ambulatory Visit: Payer: Self-pay | Admitting: Urology

## 2024-05-20 DIAGNOSIS — N401 Enlarged prostate with lower urinary tract symptoms: Secondary | ICD-10-CM

## 2024-05-20 DIAGNOSIS — R350 Frequency of micturition: Secondary | ICD-10-CM

## 2024-05-20 DIAGNOSIS — R3912 Poor urinary stream: Secondary | ICD-10-CM

## 2024-05-20 DIAGNOSIS — R351 Nocturia: Secondary | ICD-10-CM

## 2024-05-20 DIAGNOSIS — R3915 Urgency of urination: Secondary | ICD-10-CM

## 2024-05-22 ENCOUNTER — Encounter: Payer: Self-pay | Admitting: Urology

## 2024-05-27 ENCOUNTER — Other Ambulatory Visit

## 2024-05-28 ENCOUNTER — Encounter: Payer: Self-pay | Admitting: Urology
